# Patient Record
Sex: Female | Born: 1995 | Hispanic: No | Marital: Single | Smoking: Never smoker
Health system: Southern US, Community
[De-identification: ages and names within clinical notes are randomized; demographics above are authoritative.]

## PROBLEM LIST (undated history)

## (undated) DIAGNOSIS — Z789 Other specified health status: Secondary | ICD-10-CM

## (undated) HISTORY — PX: NO PAST SURGERIES: SHX2092

## (undated) HISTORY — PX: WISDOM TOOTH EXTRACTION: SHX21

## (undated) HISTORY — DX: Other specified health status: Z78.9

---
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
## 2014-07-15 ENCOUNTER — Telehealth: Payer: Self-pay | Admitting: *Deleted

## 2014-07-15 MED ORDER — NORETHIN ACE-ETH ESTRAD-FE 1-20 MG-MCG PO TABS: 1.0000 | ORAL_TABLET | Freq: Every day | ORAL | Status: DC

## 2014-07-15 NOTE — Telephone Encounter (Signed)
Pt would like to have a 90 day supply of birth control pill, rx sent

## 2015-05-25 ENCOUNTER — Other Ambulatory Visit: Payer: Self-pay | Admitting: *Deleted

## 2015-05-27 ENCOUNTER — Other Ambulatory Visit: Payer: Self-pay

## 2015-11-18 ENCOUNTER — Encounter: Payer: Self-pay | Admitting: Women's Health

## 2015-11-20 ENCOUNTER — Emergency Department (INDEPENDENT_AMBULATORY_CARE_PROVIDER_SITE_OTHER)
Admission: EM | Admit: 2015-11-20 | Discharge: 2015-11-20 | Disposition: A | Discharge status: Home / Self Care | Payer: 59 | Source: Home / Self Care | Attending: Family Medicine | Admitting: Family Medicine

## 2015-11-20 ENCOUNTER — Encounter (HOSPITAL_COMMUNITY): Payer: Self-pay | Admitting: *Deleted

## 2015-11-20 DIAGNOSIS — J069 Acute upper respiratory infection, unspecified: Secondary | ICD-10-CM

## 2015-11-20 LAB — POCT RAPID STREP A: STREPTOCOCCUS, GROUP A SCREEN (DIRECT): NEGATIVE

## 2015-11-20 MED ORDER — IPRATROPIUM BROMIDE 0.06 % NA SOLN: 2.0000 | Freq: Four times a day (QID) | NASAL | Status: DC

## 2015-11-20 NOTE — ED Provider Notes (Signed)
CSN: [id number]     Arrival date & time 11/20/15  1642 History   First MD Initiated Contact with Patient 11/20/15 1756     Chief Complaint  Patient presents with  . Sore Throat   (Consider location/radiation/quality/duration/timing/severity/associated sxs/prior Treatment) Patient is a 20 y.o. female presenting with pharyngitis. The history is provided by the patient.  Sore Throat This is a new problem. The current episode started 2 days ago. The problem has not changed since onset.Pertinent negatives include no chest pain, no abdominal pain and no headaches. The symptoms are aggravated by swallowing.    History reviewed. No pertinent past medical history. History reviewed. No pertinent past surgical history. Family History  Problem Relation Age of Onset  . Hyperlipidemia Mother   . Heart disease Maternal Grandmother    Social History  Substance Use Topics  . Smoking status: Never Smoker   . Smokeless tobacco: Never Used  . Alcohol Use: No   OB History    Gravida Para Term Preterm AB TAB SAB Ectopic Multiple Living   0              Review of Systems  Constitutional: Negative.   HENT: Positive for congestion, postnasal drip, rhinorrhea and sore throat.   Respiratory: Negative.   Cardiovascular: Negative.  Negative for chest pain.  Gastrointestinal: Negative for abdominal pain.  Neurological: Negative for headaches.  All other systems reviewed and are negative.   Allergies  Review of patient's allergies indicates no known allergies.  Home Medications   Prior to Admission medications   Medication Sig Start Date End Date Taking? Authorizing Provider  acetaminophen (TYLENOL) 325 MG tablet Take 650 mg by mouth every 6 (six) hours as needed.    Historical Provider, MD  ipratropium (ATROVENT) 0.06 % nasal spray Place 2 sprays into both nostrils 4 (four) times daily. 11/20/15   Linna HoffJames D Myndi Wamble, MD  norethindrone-ethinyl estradiol (JUNEL FE,GILDESS FE,LOESTRIN FE) 1-20 MG-MCG tablet  Take 1 tablet by mouth daily. 07/15/14   Harrington ChallengerNancy J Young, NP   Meds Ordered and Administered this Visit  Medications - No data to display  BP 116/79 mmHg  Pulse 57  Temp(Src) 98.5 F (36.9 C) (Oral)  SpO2 100%  LMP 11/03/2015 No data found.   Physical Exam  Constitutional: She is oriented to person, place, and time. She appears well-developed and well-nourished. No distress.  HENT:  Head: Normocephalic.  Right Ear: External ear normal.  Left Ear: External ear normal.  Mouth/Throat: Oropharynx is clear and moist.  Neck: Normal range of motion. Neck supple.  Pulmonary/Chest: Effort normal and breath sounds normal.  Lymphadenopathy:    She has no cervical adenopathy.  Neurological: She is alert and oriented to person, place, and time.  Skin: Skin is warm and dry.  Nursing note and vitals reviewed.   ED Course  Procedures (including critical care time)  Labs Review Labs Reviewed  POCT RAPID STREP A  strep neg.  Imaging Review No results found.   Visual Acuity Review  Right Eye Distance:   Left Eye Distance:   Bilateral Distance:    Right Eye Near:   Left Eye Near:    Bilateral Near:         MDM   1. URI (upper respiratory infection)        Linna HoffJames D Lyam Provencio, MD 11/20/15 1820

## 2015-11-20 NOTE — ED Notes (Signed)
Pt   Reports       sorethroat      X   2  Days     Hurts  To  Swallow            Pt  Sitting  On  Exam table    Speaking  In  Complete  sentances  And  Is  In no  Acute  Distress

## 2015-11-20 NOTE — Discharge Instructions (Signed)
Drink plenty of fluids as discussed, use medicine as prescribed, and mucinex or delsym for cough. Return or see your doctor if further problems °

## 2015-11-23 LAB — CULTURE, GROUP A STREP

## 2015-11-24 NOTE — ED Notes (Signed)
Discussed w JD Kindl. No treatment required

## 2015-12-13 ENCOUNTER — Encounter: Payer: Self-pay | Admitting: Women's Health

## 2015-12-13 ENCOUNTER — Ambulatory Visit (INDEPENDENT_AMBULATORY_CARE_PROVIDER_SITE_OTHER): Payer: 59 | Admitting: Women's Health

## 2015-12-13 VITALS — BP 122/80 | Ht 63.0 in | Wt 152.0 lb

## 2015-12-13 DIAGNOSIS — B9689 Other specified bacterial agents as the cause of diseases classified elsewhere: Secondary | ICD-10-CM

## 2015-12-13 DIAGNOSIS — Z01419 Encounter for gynecological examination (general) (routine) without abnormal findings: Secondary | ICD-10-CM | POA: Diagnosis not present

## 2015-12-13 DIAGNOSIS — Z30011 Encounter for initial prescription of contraceptive pills: Secondary | ICD-10-CM

## 2015-12-13 DIAGNOSIS — B3731 Acute candidiasis of vulva and vagina: Secondary | ICD-10-CM

## 2015-12-13 DIAGNOSIS — B373 Candidiasis of vulva and vagina: Secondary | ICD-10-CM | POA: Diagnosis not present

## 2015-12-13 DIAGNOSIS — N760 Acute vaginitis: Secondary | ICD-10-CM | POA: Diagnosis not present

## 2015-12-13 DIAGNOSIS — A499 Bacterial infection, unspecified: Secondary | ICD-10-CM | POA: Diagnosis not present

## 2015-12-13 LAB — CBC WITH DIFFERENTIAL/PLATELET
BASOS ABS: 0.1 10*3/uL (ref 0.0–0.1)
BASOS PCT: 1 % (ref 0–1)
EOS ABS: 0.1 10*3/uL (ref 0.0–0.7)
Eosinophils Relative: 1 % (ref 0–5)
HCT: 41.1 % (ref 36.0–46.0)
Hemoglobin: 13.8 g/dL (ref 12.0–15.0)
LYMPHS ABS: 2.4 10*3/uL (ref 0.7–4.0)
Lymphocytes Relative: 39 % (ref 12–46)
MCH: 30.3 pg (ref 26.0–34.0)
MCHC: 33.6 g/dL (ref 30.0–36.0)
MCV: 90.3 fL (ref 78.0–100.0)
MPV: 8.5 fL — AB (ref 8.6–12.4)
Monocytes Absolute: 0.4 10*3/uL (ref 0.1–1.0)
Monocytes Relative: 6 % (ref 3–12)
NEUTROS PCT: 53 % (ref 43–77)
Neutro Abs: 3.3 10*3/uL (ref 1.7–7.7)
PLATELETS: 365 10*3/uL (ref 150–400)
RBC: 4.55 MIL/uL (ref 3.87–5.11)
RDW: 13.2 % (ref 11.5–15.5)
WBC: 6.2 10*3/uL (ref 4.0–10.5)

## 2015-12-13 LAB — WET PREP FOR TRICH, YEAST, CLUE: Trich, Wet Prep: NONE SEEN

## 2015-12-13 MED ORDER — NORGESTIMATE-ETH ESTRADIOL 0.25-35 MG-MCG PO TABS: 1.0000 | ORAL_TABLET | Freq: Every day | ORAL | Status: DC

## 2015-12-13 MED ORDER — METRONIDAZOLE 0.75 % VA GEL: VAGINAL | Status: DC

## 2015-12-13 MED ORDER — FLUCONAZOLE 150 MG PO TABS: 150.0000 mg | ORAL_TABLET | Freq: Once | ORAL | Status: DC

## 2015-12-13 NOTE — Patient Instructions (Addendum)
Health Maintenance, Female Adopting a healthy lifestyle and getting preventive care can go a long way to promote health and wellness. Talk with your health care provider about what schedule of regular examinations is right for you. This is a good chance for you to check in with your provider about disease prevention and staying healthy. In between checkups, there are plenty of things you can do on your own. Experts have done a lot of research about which lifestyle changes and preventive measures are most likely to keep you healthy. Ask your health care provider for more information. WEIGHT AND DIET  Eat a healthy diet  Be sure to include plenty of vegetables, fruits, low-fat dairy products, and lean protein.  Do not eat a lot of foods high in solid fats, added sugars, or salt.  Get regular exercise. This is one of the most important things you can do for your health.  Most adults should exercise for at least 150 minutes each week. The exercise should increase your heart rate and make you sweat (moderate-intensity exercise).  Most adults should also do strengthening exercises at least twice a week. This is in addition to the moderate-intensity exercise.  Maintain a healthy weight  Body mass index (BMI) is a measurement that can be used to identify possible weight problems. It estimates body fat based on height and weight. Your health care provider can help determine your BMI and help you achieve or maintain a healthy weight.  For females 20 years of age and older:   A BMI below 18.5 is considered underweight.  A BMI of 18.5 to 24.9 is normal.  A BMI of 25 to 29.9 is considered overweight.  A BMI of 30 and above is considered obese.  Watch levels of cholesterol and blood lipids  You should start having your blood tested for lipids and cholesterol at 20 years of age, then have this test every 5 years.  You may need to have your cholesterol levels checked more often if:  Your lipid  or cholesterol levels are high.  You are older than 21 years of age.  You are at high risk for heart disease.  CANCER SCREENING   Lung Cancer  Lung cancer screening is recommended for adults 55-80 years old who are at high risk for lung cancer because of a history of smoking.  A yearly low-dose CT scan of the lungs is recommended for people who:  Currently smoke.  Have quit within the past 15 years.  Have at least a 30-pack-year history of smoking. A pack year is smoking an average of one pack of cigarettes a day for 1 year.  Yearly screening should continue until it has been 15 years since you quit.  Yearly screening should stop if you develop a health problem that would prevent you from having lung cancer treatment.  Breast Cancer  Practice breast self-awareness. This means understanding how your breasts normally appear and feel.  It also means doing regular breast self-exams. Let your health care provider know about any changes, no matter how small.  If you are in your 20s or 30s, you should have a clinical breast exam (CBE) by a health care provider every 1-3 years as part of a regular health exam.  If you are 40 or older, have a CBE every year. Also consider having a breast X-ray (mammogram) every year.  If you have a family history of breast cancer, talk to your health care provider about genetic screening.  If you   are at high risk for breast cancer, talk to your health care provider about having an MRI and a mammogram every year.  Breast cancer gene (BRCA) assessment is recommended for women who have family members with BRCA-related cancers. BRCA-related cancers include:  Breast.  Ovarian.  Tubal.  Peritoneal cancers.  Results of the assessment will determine the need for genetic counseling and BRCA1 and BRCA2 testing. Cervical Cancer Your health care provider may recommend that you be screened regularly for cancer of the pelvic organs (ovaries, uterus, and  vagina). This screening involves a pelvic examination, including checking for microscopic changes to the surface of your cervix (Pap test). You may be encouraged to have this screening done every 3 years, beginning at age 21.  For women ages 30-65, health care providers may recommend pelvic exams and Pap testing every 3 years, or they may recommend the Pap and pelvic exam, combined with testing for human papilloma virus (HPV), every 5 years. Some types of HPV increase your risk of cervical cancer. Testing for HPV may also be done on women of any age with unclear Pap test results.  Other health care providers may not recommend any screening for nonpregnant women who are considered low risk for pelvic cancer and who do not have symptoms. Ask your health care provider if a screening pelvic exam is right for you.  If you have had past treatment for cervical cancer or a condition that could lead to cancer, you need Pap tests and screening for cancer for at least 20 years after your treatment. If Pap tests have been discontinued, your risk factors (such as having a new sexual partner) need to be reassessed to determine if screening should resume. Some women have medical problems that increase the chance of getting cervical cancer. In these cases, your health care provider may recommend more frequent screening and Pap tests. Colorectal Cancer  This type of cancer can be detected and often prevented.  Routine colorectal cancer screening usually begins at 21 years of age and continues through 21 years of age.  Your health care provider may recommend screening at an earlier age if you have risk factors for colon cancer.  Your health care provider may also recommend using home test kits to check for hidden blood in the stool.  A small camera at the end of a tube can be used to examine your colon directly (sigmoidoscopy or colonoscopy). This is done to check for the earliest forms of colorectal  cancer.  Routine screening usually begins at age 50.  Direct examination of the colon should be repeated every 5-10 years through 21 years of age. However, you may need to be screened more often if early forms of precancerous polyps or small growths are found. Skin Cancer  Check your skin from head to toe regularly.  Tell your health care provider about any new moles or changes in moles, especially if there is a change in a mole's shape or color.  Also tell your health care provider if you have a mole that is larger than the size of a pencil eraser.  Always use sunscreen. Apply sunscreen liberally and repeatedly throughout the day.  Protect yourself by wearing long sleeves, pants, a wide-brimmed hat, and sunglasses whenever you are outside. HEART DISEASE, DIABETES, AND HIGH BLOOD PRESSURE   High blood pressure causes heart disease and increases the risk of stroke. High blood pressure is more likely to develop in:  People who have blood pressure in the high end   of the normal range (130-139/85-89 mm Hg).  People who are overweight or obese.  People who are African American.  If you are 38-23 years of age, have your blood pressure checked every 3-5 years. If you are 61 years of age or older, have your blood pressure checked every year. You should have your blood pressure measured twice--once when you are at a hospital or clinic, and once when you are not at a hospital or clinic. Record the average of the two measurements. To check your blood pressure when you are not at a hospital or clinic, you can use:  An automated blood pressure machine at a pharmacy.  A home blood pressure monitor.  If you are between 45 years and 39 years old, ask your health care provider if you should take aspirin to prevent strokes.  Have regular diabetes screenings. This involves taking a blood sample to check your fasting blood sugar level.  If you are at a normal weight and have a low risk for diabetes,  have this test once every three years after 21 years of age.  If you are overweight and have a high risk for diabetes, consider being tested at a younger age or more often. PREVENTING INFECTION  Hepatitis B  If you have a higher risk for hepatitis B, you should be screened for this virus. You are considered at high risk for hepatitis B if:  You were born in a country where hepatitis B is common. Ask your health care provider which countries are considered high risk.  Your parents were born in a high-risk country, and you have not been immunized against hepatitis B (hepatitis B vaccine).  You have HIV or AIDS.  You use needles to inject street drugs.  You live with someone who has hepatitis B.  You have had sex with someone who has hepatitis B.  You get hemodialysis treatment.  You take certain medicines for conditions, including cancer, organ transplantation, and autoimmune conditions. Hepatitis C  Blood testing is recommended for:  Everyone born from [birth year] through 1966.  Anyone with known risk factors for hepatitis C. Sexually transmitted infections (STIs)  You should be screened for sexually transmitted infections (STIs) including gonorrhea and chlamydia if:  You are sexually active and are younger than 21 years of age.  You are older than 21 years of age and your health care provider tells you that you are at risk for this type of infection.  Your sexual activity has changed since you were last screened and you are at an increased risk for chlamydia or gonorrhea. Ask your health care provider if you are at risk.  If you do not have HIV, but are at risk, it may be recommended that you take a prescription medicine daily to prevent HIV infection. This is called pre-exposure prophylaxis (PrEP). You are considered at risk if:  You are sexually active and do not regularly use condoms or know the HIV status of your partner(s).  You take drugs by injection.  You are sexually  active with a partner who has HIV. Talk with your health care provider about whether you are at high risk of being infected with HIV. If you choose to begin PrEP, you should first be tested for HIV. You should then be tested every 3 months for as long as you are taking PrEP.  PREGNANCY   If you are premenopausal and you may become pregnant, ask your health care provider about preconception counseling.  If you may  become pregnant, take 400 to 800 micrograms (mcg) of folic acid every day.  If you want to prevent pregnancy, talk to your health care provider about birth control (contraception). OSTEOPOROSIS AND MENOPAUSE   Osteoporosis is a disease in which the bones lose minerals and strength with aging. This can result in serious bone fractures. Your risk for osteoporosis can be identified using a bone density scan.  If you are 11 years of age or older, or if you are at risk for osteoporosis and fractures, ask your health care provider if you should be screened.  Ask your health care provider whether you should take a calcium or vitamin D supplement to lower your risk for osteoporosis.  Menopause may have certain physical symptoms and risks.  Hormone replacement therapy may reduce some of these symptoms and risks. Talk to your health care provider about whether hormone replacement therapy is right for you.  HOME CARE INSTRUCTIONS   Schedule regular health, dental, and eye exams.  Stay current with your immunizations.   Do not use any tobacco products including cigarettes, chewing tobacco, or electronic cigarettes.  If you are pregnant, do not drink alcohol.  If you are breastfeeding, limit how much and how often you drink alcohol.  Limit alcohol intake to no more than 1 drink per day for nonpregnant women. One drink equals 12 ounces of beer, 5 ounces of wine, or 1 ounces of hard liquor.  Do not use street drugs.  Do not share needles.  Ask your health care provider for help if  you need support or information about quitting drugs.  Tell your health care provider if you often feel depressed.  Tell your health care provider if you have ever been abused or do not feel safe at home.   This information is not intended to replace advice given to you by your health care provider. Make sure you discuss any questions you have with your health care provider.   Document Released: 05/21/2011 Document Revised: 11/26/2014 Document Reviewed: 10/07/2013 Elsevier Interactive Patient Education 2016 Else[employer]. Monilial Vaginitis Vaginitis in a soreness, swelling and redness (inflammation) of the vagina and vulva. Monilial vaginitis is not a sexually transmitted infection. CAUSES  Yeast vaginitis is caused by yeast (candida) that is normally found in your vagina. With a yeast infection, the candida has overgrown in number to a point that upsets the chemical balance. SYMPTOMS   White, thick vaginal discharge.  Swelling, itching, redness and irritation of the vagina and possibly the lips of the vagina (vulva).  Burning or painful urination.  Painful intercourse. DIAGNOSIS  Things that may contribute to monilial vaginitis are:  Postmenopausal and virginal states.  Pregnancy.  Infections.  Being tired, sick or stressed, especially if you had monilial vaginitis in the past.  Diabetes. Good control will help lower the chance.  Birth control pills.  Tight fitting garments.  Using bubble bath, feminine sprays, douches or deodorant tampons.  Taking certain medications that kill germs (antibiotics).  Sporadic recurrence can occur if you become ill. TREATMENT  Your caregiver will give you medication.  There are several kinds of anti monilial vaginal creams and suppositories specific for monilial vaginitis. For recurrent yeast infections, use a suppository or cream in the vagina 2 times a week, or as directed.  Anti-monilial or steroid cream for the itching or  irritation of the vulva may also be used. Get your caregiver's permission.  Painting the vagina with methylene blue solution may help if the monilial cream does  not work.  Eating yogurt may help prevent monilial vaginitis. HOME CARE INSTRUCTIONS   Finish all medication as prescribed.  Do not have sex until treatment is completed or after your caregiver tells you it is okay.  Take warm sitz baths.  Do not douche.  Do not use tampons, especially scented ones.  Wear cotton underwear.  Avoid tight pants and panty hose.  Tell your sexual partner that you have a yeast infection. They should go to their caregiver if they have symptoms such as mild rash or itching.  Your sexual partner should be treated as well if your infection is difficult to eliminate.  Practice safer sex. Use condoms.  Some vaginal medications cause latex condoms to fail. Vaginal medications that harm condoms are:  Cleocin cream.  Butoconazole (Femstat).  Terconazole (Terazol) vaginal suppository.  Miconazole (Monistat) (may be purchased over the counter). SEEK MEDICAL CARE IF:   You have a temperature by mouth above 102 F (38.9 C).  The infection is getting worse after 2 days of treatment.  The infection is not getting better after 3 days of treatment.  You develop blisters in or around your vagina.  You develop vaginal bleeding, and it is not your menstrual period.  You have pain when you urinate.  You develop intestinal problems.  You have pain with sexual intercourse.   This information is not intended to replace advice given to you by your health care provider. Make sure you discuss any questions you have with your health care provider.   Document Released: 08/15/2005 Document Revised: 01/28/2012 Document Reviewed: 05/09/2015 Elsevier Interactive Patient Education [employer].

## 2015-12-13 NOTE — Progress Notes (Signed)
Alexandra Blackburn 1996/02/14 [id number]    History:    Presents for annual exam.  Regular monthly cycle using no contraception. Negative STD screen with current partner at primary care. Has received gardasil.  Past medical history, past surgical history, family history and social history were all reviewed and documented in the EPIC chart. 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 Parents healthy.  ROS:  A ROS was performed and pertinent positives and negatives are included.  Exam:  Filed Vitals:   12/13/15 1033  BP: 122/80    General appearance:  Normal Thyroid:  Symmetrical, normal in size, without palpable masses or nodularity. Respiratory  Auscultation:  Clear without wheezing or rhonchi Cardiovascular  Auscultation:  Regular rate, without rubs, murmurs or gallops  Edema/varicosities:  Not grossly evident Abdominal  Soft,nontender, without masses, guarding or rebound.  Liver/spleen:  No organomegaly noted  Hernia:  None appreciated  Skin  Inspection:  Grossly normal numerous tattoos   Breasts: Examined lying and sitting/bilateral piercing    Right: Without masses, retractions, discharge or axillary adenopathy.     Left: Without masses, retractions, discharge or axillary adenopathy. Gentitourinary   Inguinal/mons:  Normal without inguinal adenopathy  External genitalia:  Normal  BUS/Urethra/Skene's glands:  Normal  Vagina:  Moderate curdy discharge wet prep positive for many yeast, few clues, TNTC bacteria  Cervix:  Normal  Uterus:   normal in size, shape and contour.  Midline and mobile  Adnexa/parametria:     Rt: Without masses or tenderness.   Lt: Without masses or tenderness.  Anus and perineum: Normal    Assessment/Plan:  20 y.o. SBF G0 for annual exam with no complaints.    Monthly cycle/dysmenorrhea Contraception management Yeast vaginitis and bacterial vaginosis  Plan: Contraception options reviewed will try  Sprintec prescription, proper use given and reviewed slight risk for blood clots and strokes. Start with next cycle, condoms encouraged until after first month on Sprintec. SBE's, exercise, calcium rich diet, MVI daily encouraged. Diflucan 150 by mouth times one dose with refill. MetroGel vaginal cream 1 applicator at bedtime 5, alcohol precautions reviewed. Instructed to call for no relief of discharge. CBC, UAHarrington Challenger WHNP, 11:04 AM 12/13/2015

## 2015-12-14 LAB — URINALYSIS W MICROSCOPIC + REFLEX CULTURE
Bilirubin Urine: NEGATIVE
Casts: NONE SEEN [LPF]
Crystals: NONE SEEN [HPF]
GLUCOSE, UA: NEGATIVE
HGB URINE DIPSTICK: NEGATIVE
Ketones, ur: NEGATIVE
NITRITE: NEGATIVE
PH: 5.5 (ref 5.0–8.0)
Protein, ur: NEGATIVE
RBC / HPF: NONE SEEN RBC/HPF (ref <=2)
Specific Gravity, Urine: 1.026 (ref 1.001–1.035)
Yeast: NONE SEEN [HPF]

## 2015-12-15 LAB — URINE CULTURE: Colony Count: 50000

## 2016-10-22 ENCOUNTER — Encounter (HOSPITAL_COMMUNITY): Payer: Self-pay | Admitting: Emergency Medicine

## 2016-10-22 ENCOUNTER — Ambulatory Visit (HOSPITAL_COMMUNITY)
Admission: EM | Admit: 2016-10-22 | Discharge: 2016-10-22 | Disposition: A | Discharge status: Home / Self Care | Payer: 59 | Attending: Family Medicine | Admitting: Family Medicine

## 2016-10-22 DIAGNOSIS — Z79899 Other long term (current) drug therapy: Secondary | ICD-10-CM | POA: Insufficient documentation

## 2016-10-22 DIAGNOSIS — J069 Acute upper respiratory infection, unspecified: Secondary | ICD-10-CM | POA: Diagnosis not present

## 2016-10-22 DIAGNOSIS — J029 Acute pharyngitis, unspecified: Secondary | ICD-10-CM | POA: Insufficient documentation

## 2016-10-22 LAB — POCT RAPID STREP A: Streptococcus, Group A Screen (Direct): NEGATIVE

## 2016-10-22 NOTE — ED Provider Notes (Signed)
[facility]    CSN: [id number] Arrival date & time: 10/22/16  1945     History   Chief Complaint Chief Complaint  Patient presents with  . Sore Throat    HPI Alexandra Blackburn is a 20 y.o. female.   The history is provided by the patient.  Sore Throat  This is a new problem. The current episode started 6 to 12 hours ago. The problem has not changed since onset.Pertinent negatives include no chest pain and no abdominal pain. Nothing aggravates the symptoms.    History reviewed. No pertinent past medical history.  Patient Active Problem List   Diagnosis Date Noted  . Dysmenorrhea 10/23/2013    History reviewed. No pertinent surgical history.  OB History    Gravida Para Term Preterm AB Living   0             SAB TAB Ectopic Multiple Live Births                   Home Medications    Prior to Admission medications   Medication Sig Start Date End Date Taking? Authorizing Provider  norgestimate-ethinyl estradiol (ORTHO-CYCLEN,SPRINTEC,PREVIFEM) 0.25-35 MG-MCG tablet Take 1 tablet by mouth daily. 12/13/15  Yes Harrington ChallengerNancy J Young, NP  fluconazole (DIFLUCAN) 150 MG tablet Take 1 tablet (150 mg total) by mouth once. 12/13/15   Harrington ChallengerNancy J Young, NP  metroNIDAZOLE (METROGEL VAGINAL) 0.75 % vaginal gel 1 applicator per vagina at HS x 5 12/13/15   Harrington ChallengerNancy J Young, NP    Family History Family History  Problem Relation Age of Onset  . Hyperlipidemia Mother   . Heart disease Maternal Grandmother     Social History Social History  Substance Use Topics  . Smoking status: Never Smoker  . Smokeless tobacco: Never Used  . Alcohol use No     Allergies   Patient has no known allergies.   Review of Systems Review of Systems  Constitutional: Negative.   HENT: Positive for postnasal drip and rhinorrhea.   Respiratory: Negative.   Cardiovascular: Negative.  Negative for chest pain.  Gastrointestinal: Negative for abdominal pain.  All other systems reviewed and are  negative.    Physical Exam Triage Vital Signs ED Triage Vitals  Enc Vitals Group     BP 10/22/16 2008 93/86     Pulse Rate 10/22/16 2008 92     Resp 10/22/16 2008 16     Temp 10/22/16 2008 100.4 F (38 C)     Temp Source 10/22/16 2008 Oral     SpO2 10/22/16 2008 99 %     Weight --      Height --      Head Circumference --      Peak Flow --      Pain Score 10/22/16 2007 4     Pain Loc --      Pain Edu? --      Excl. in GC? --    No data found.   Updated Vital Signs BP 93/86 (BP Location: Left Arm)   Pulse 92   Temp 100.4 F (38 C) (Oral)   Resp 16   LMP 10/09/2016   SpO2 99%   Visual Acuity Right Eye Distance:   Left Eye Distance:   Bilateral Distance:    Right Eye Near:   Left Eye Near:    Bilateral Near:     Physical Exam  Constitutional: She is oriented to person, place, and time. She appears well-developed and  well-nourished.  HENT:  Right Ear: External ear normal.  Left Ear: External ear normal.  Mouth/Throat: Oropharynx is clear and moist.  Eyes: Conjunctivae are normal. Pupils are equal, round, and reactive to light.  Neck: Normal range of motion. Neck supple.  Cardiovascular: Normal rate and regular rhythm.   Pulmonary/Chest: Effort normal and breath sounds normal.  Lymphadenopathy:    She has cervical adenopathy.  Neurological: She is alert and oriented to person, place, and time. No cranial nerve deficit.  Skin: Skin is warm and dry.  Nursing note and vitals reviewed.    UC Treatments / Results  Labs (all labs ordered are listed, but only abnormal results are displayed) Labs Reviewed  POCT RAPID STREP A  strep-neg.  EKG  EKG Interpretation None       Radiology No results found.  Procedures Procedures (including critical care time)  Medications Ordered in UC Medications - No data to display   Initial Impression / Assessment and Plan / UC Course  I have reviewed the triage vital signs and the nursing notes.  Pertinent  labs & imaging results that were available during my care of the patient were reviewed by me and considered in my medical decision making (see chart for details).  Clinical Course       Final Clinical Impressions(s) / UC Diagnoses   Final diagnoses:  None    New Prescriptions New Prescriptions   No medications on file     Linna HoffJames D Kindl, MD 10/22/16 2039

## 2016-10-22 NOTE — Discharge Instructions (Signed)
Drink plenty of fluids as discussed, use mucinex or delsym for cough. Return or see your doctor if further problems °

## 2016-10-22 NOTE — ED Triage Notes (Signed)
Reports knot on right side of neck and under chin.  No pain in throat with swallowing.  Denies fever.  Denies runny nose, denies cough.  No difficulty breathing

## 2016-10-25 LAB — CULTURE, GROUP A STREP (THRC)

## 2016-12-13 ENCOUNTER — Encounter: Payer: 59 | Admitting: Women's Health

## 2016-12-19 ENCOUNTER — Encounter: Payer: Self-pay | Admitting: Women's Health

## 2016-12-19 ENCOUNTER — Ambulatory Visit (INDEPENDENT_AMBULATORY_CARE_PROVIDER_SITE_OTHER): Payer: 59 | Admitting: Women's Health

## 2016-12-19 VITALS — BP 128/74 | Ht 63.0 in | Wt 175.0 lb

## 2016-12-19 DIAGNOSIS — Z01419 Encounter for gynecological examination (general) (routine) without abnormal findings: Secondary | ICD-10-CM

## 2016-12-19 DIAGNOSIS — B9689 Other specified bacterial agents as the cause of diseases classified elsewhere: Secondary | ICD-10-CM

## 2016-12-19 DIAGNOSIS — Z113 Encounter for screening for infections with a predominantly sexual mode of transmission: Secondary | ICD-10-CM

## 2016-12-19 DIAGNOSIS — B373 Candidiasis of vulva and vagina: Secondary | ICD-10-CM

## 2016-12-19 DIAGNOSIS — B3731 Acute candidiasis of vulva and vagina: Secondary | ICD-10-CM

## 2016-12-19 DIAGNOSIS — Z30011 Encounter for initial prescription of contraceptive pills: Secondary | ICD-10-CM

## 2016-12-19 DIAGNOSIS — N760 Acute vaginitis: Secondary | ICD-10-CM

## 2016-12-19 LAB — CBC WITH DIFFERENTIAL/PLATELET
BASOS ABS: 54 {cells}/uL (ref 0–200)
BASOS PCT: 1 %
EOS ABS: 108 {cells}/uL (ref 15–500)
EOS PCT: 2 %
HCT: 36.8 % (ref 35.0–45.0)
HEMOGLOBIN: 12.2 g/dL (ref 11.7–15.5)
LYMPHS ABS: 1620 {cells}/uL (ref 850–3900)
Lymphocytes Relative: 30 %
MCH: 29.2 pg (ref 27.0–33.0)
MCHC: 33.2 g/dL (ref 32.0–36.0)
MCV: 88 fL (ref 80.0–100.0)
MPV: 8.3 fL (ref 7.5–12.5)
Monocytes Absolute: 378 cells/uL (ref 200–950)
Monocytes Relative: 7 %
NEUTROS ABS: 3240 {cells}/uL (ref 1500–7800)
Neutrophils Relative %: 60 %
PLATELETS: 354 10*3/uL (ref 140–400)
RBC: 4.18 MIL/uL (ref 3.80–5.10)
RDW: 13.1 % (ref 11.0–15.0)
WBC: 5.4 10*3/uL (ref 3.8–10.8)

## 2016-12-19 LAB — WET PREP FOR TRICH, YEAST, CLUE: Trich, Wet Prep: NONE SEEN

## 2016-12-19 MED ORDER — METRONIDAZOLE 500 MG PO TABS: 500.0000 mg | ORAL_TABLET | Freq: Two times a day (BID) | ORAL | 0 refills | Status: DC

## 2016-12-19 MED ORDER — NORGESTIMATE-ETH ESTRADIOL 0.25-35 MG-MCG PO TABS
1.0000 | ORAL_TABLET | Freq: Every day | ORAL | 4 refills | Status: DC
Start: 2016-12-19 — End: 2018-04-02

## 2016-12-19 MED ORDER — FLUCONAZOLE 150 MG PO TABS: 150.0000 mg | ORAL_TABLET | Freq: Once | ORAL | 1 refills | Status: AC

## 2016-12-19 NOTE — Addendum Note (Signed)
Addended by: Berna SpareASTILLO, Zadiel Leyh A on: 12/19/2016 03:38 PM   Modules accepted: Orders

## 2016-12-19 NOTE — Addendum Note (Signed)
Addended by: Berna SpareASTILLO, BLANCA A on: 12/19/2016 03:56 PM   Modules accepted: Orders

## 2016-12-19 NOTE — Progress Notes (Signed)
Alexandra Blackburn October 28, 1996 [id number]    History:    Presents for annual exam.  Monthly 4-5 day cycle on Sprintec with good relief of dysmenorrhea but occasional menorrhagia. Gardasil series completed. Not sexually active, negative STD screen with past partner. 20 pound weight gain in the past year.  Past medical history, past surgical history, family history and social history were all reviewed and documented in the EPIC chart. Working in a lab. Parents healthy.  ROS:  A ROS was performed and pertinent positives and negatives are included.  Exam:  Vitals:   12/19/16 1414  BP: 128/74  Weight: 175 lb (79.4 kg)  Height: 5\' 3"  (1.6 m)   Body mass index is 31 kg/m.   General appearance:  Normal Thyroid:  Symmetrical, normal in size, without palpable masses or nodularity. Respiratory  Auscultation:  Clear without wheezing or rhonchi Cardiovascular  Auscultation:  Regular rate, without rubs, murmurs or gallops  Edema/varicosities:  Not grossly evident Abdominal  Soft,nontender, without masses, guarding or rebound.  Liver/spleen:  No organomegaly noted  Hernia:  None appreciated  Skin  Inspection:  Grossly normal   Breasts: Examined lying and sitting. Bilateral piercings    Right: Without masses, retractions, discharge or axillary adenopathy.     Left: Without masses, retractions, discharge or axillary adenopathy. Gentitourinary   Inguinal/mons:  Normal without inguinal adenopathy  External genitalia:  Normal  BUS/Urethra/Skene's glands:  Normal  Vagina:  Moderate white discharge with odor, wet prep positive for yeast and clues, TNTC bacteria  Cervix:  Normal  Uterus:   normal in size, shape and contour.  Midline and mobile  Adnexa/parametria:     Rt: Without masses or tenderness.   Lt: Without masses or tenderness.  Anus and perineum: Normal    Assessment/Plan:  21 y.o. S WF G0 for annual exam.  Monthly cycle on Sprintec Bacteria vaginosis Yeast  vaginitis Obesity  Plan: Sprintec prescription, proper use, slight risk for blood clots and strokes reviewed. Condoms encouraged if sexually active. Flagyl 500 twice daily for 7 days, #14, alcohol precautions reviewed. Diflucan 150 by mouth times one dose with refill. Yeast prevention discussed. SBE's, increase exercise and decrease calories for weight loss, calcium rich diet, MVI daily encouraged. Campus safety reviewed. CBC,  Pap, GC/Chlamydia, declines need for HIV, hepatitis or RPR.    Harrington ChallengerYOUNG,Alexandra Blackburn [facility] CenterWHNP, 2:47 PM 12/19/2016

## 2016-12-19 NOTE — Patient Instructions (Signed)
Health Maintenance, Female Introduction Adopting a healthy lifestyle and getting preventive care can go a long way to promote health and wellness. Talk with your health care provider about what schedule of regular examinations is right for you. This is a good chance for you to check in with your provider about disease prevention and staying healthy. In between checkups, there are plenty of things you can do on your own. Experts have done a lot of research about which lifestyle changes and preventive measures are most likely to keep you healthy. Ask your health care provider for more information. Weight and diet Eat a healthy diet  Be sure to include plenty of vegetables, fruits, low-fat dairy products, and lean protein.  Do not eat a lot of foods high in solid fats, added sugars, or salt.  Get regular exercise. This is one of the most important things you can do for your health.  Most adults should exercise for at least 150 minutes each week. The exercise should increase your heart rate and make you sweat (moderate-intensity exercise).  Most adults should also do strengthening exercises at least twice a week. This is in addition to the moderate-intensity exercise. Maintain a healthy weight  Body mass index (BMI) is a measurement that can be used to identify possible weight problems. It estimates body fat based on height and weight. Your health care provider can help determine your BMI and help you achieve or maintain a healthy weight.  For females 63 years of age and older:  A BMI below 18.5 is considered underweight.  A BMI of 18.5 to 24.9 is normal.  A BMI of 25 to 29.9 is considered overweight.  A BMI of 30 and above is considered obese. Watch levels of cholesterol and blood lipids  You should start having your blood tested for lipids and cholesterol at 22 years of age, then have this test every 5 years.  You may need to have your cholesterol levels checked more often if:  Your  lipid or cholesterol levels are high.  You are older than 22 years of age.  You are at high risk for heart disease. Cancer screening Lung Cancer  Lung cancer screening is recommended for adults 56-22 years old who are at high risk for lung cancer because of a history of smoking.  A yearly low-dose CT scan of the lungs is recommended for people who:  Currently smoke.  Have quit within the past 15 years.  Have at least a 30-pack-year history of smoking. A pack year is smoking an average of one pack of cigarettes a day for 1 year.  Yearly screening should continue until it has been 15 years since you quit.  Yearly screening should stop if you develop a health problem that would prevent you from having lung cancer treatment. Breast Cancer  Practice breast self-awareness. This means understanding how your breasts normally appear and feel.  It also means doing regular breast self-exams. Let your health care provider know about any changes, no matter how small.  If you are in your 20s or 30s, you should have a clinical breast exam (CBE) by a health care provider every 1-3 years as part of a regular health exam.  If you are 35 or older, have a CBE every year. Also consider having a breast X-ray (mammogram) every year.  If you have a family history of breast cancer, talk to your health care provider about genetic screening.  If you are at high risk for breast cancer,  talk to your health care provider about having an MRI and a mammogram every year.  Breast cancer gene (BRCA) assessment is recommended for women who have family members with BRCA-related cancers. BRCA-related cancers include:  Breast.  Ovarian.  Tubal.  Peritoneal cancers.  Results of the assessment will determine the need for genetic counseling and BRCA1 and BRCA2 testing. Cervical Cancer  Your health care provider may recommend that you be screened regularly for cancer of the pelvic organs (ovaries, uterus, and  vagina). This screening involves a pelvic examination, including checking for microscopic changes to the surface of your cervix (Pap test). You may be encouraged to have this screening done every 3 years, beginning at age 21.  For women ages 30-65, health care providers may recommend pelvic exams and Pap testing every 3 years, or they may recommend the Pap and pelvic exam, combined with testing for human papilloma virus (HPV), every 5 years. Some types of HPV increase your risk of cervical cancer. Testing for HPV may also be done on women of any age with unclear Pap test results.  Other health care providers may not recommend any screening for nonpregnant women who are considered low risk for pelvic cancer and who do not have symptoms. Ask your health care provider if a screening pelvic exam is right for you.  If you have had past treatment for cervical cancer or a condition that could lead to cancer, you need Pap tests and screening for cancer for at least 20 years after your treatment. If Pap tests have been discontinued, your risk factors (such as having a new sexual partner) need to be reassessed to determine if screening should resume. Some women have medical problems that increase the chance of getting cervical cancer. In these cases, your health care provider may recommend more frequent screening and Pap tests. Colorectal Cancer  This type of cancer can be detected and often prevented.  Routine colorectal cancer screening usually begins at 22 years of age and continues through 22 years of age.  Your health care provider may recommend screening at an earlier age if you have risk factors for colon cancer.  Your health care provider may also recommend using home test kits to check for hidden blood in the stool.  A small camera at the end of a tube can be used to examine your colon directly (sigmoidoscopy or colonoscopy). This is done to check for the earliest forms of colorectal  cancer.  Routine screening usually begins at age 50.  Direct examination of the colon should be repeated every 5-10 years through 22 years of age. However, you may need to be screened more often if early forms of precancerous polyps or small growths are found. Skin Cancer  Check your skin from head to toe regularly.  Tell your health care provider about any new moles or changes in moles, especially if there is a change in a mole's shape or color.  Also tell your health care provider if you have a mole that is larger than the size of a pencil eraser.  Always use sunscreen. Apply sunscreen liberally and repeatedly throughout the day.  Protect yourself by wearing long sleeves, pants, a wide-brimmed hat, and sunglasses whenever you are outside. Heart disease, diabetes, and high blood pressure  High blood pressure causes heart disease and increases the risk of stroke. High blood pressure is more likely to develop in:  People who have blood pressure in the high end of the normal range (130-139/85-89 mm Hg).    People who are overweight or obese.  People who are African American.  If you are 18-39 years of age, have your blood pressure checked every 3-5 years. If you are 40 years of age or older, have your blood pressure checked every year. You should have your blood pressure measured twice-once when you are at a hospital or clinic, and once when you are not at a hospital or clinic. Record the average of the two measurements. To check your blood pressure when you are not at a hospital or clinic, you can use:  An automated blood pressure machine at a pharmacy.  A home blood pressure monitor.  If you are between 55 years and 79 years old, ask your health care provider if you should take aspirin to prevent strokes.  Have regular diabetes screenings. This involves taking a blood sample to check your fasting blood sugar level.  If you are at a normal weight and have a low risk for diabetes,  have this test once every three years after 22 years of age.  If you are overweight and have a high risk for diabetes, consider being tested at a younger age or more often. Preventing infection Hepatitis B  If you have a higher risk for hepatitis B, you should be screened for this virus. You are considered at high risk for hepatitis B if:  You were born in a country where hepatitis B is common. Ask your health care provider which countries are considered high risk.  Your parents were born in a high-risk country, and you have not been immunized against hepatitis B (hepatitis B vaccine).  You have HIV or AIDS.  You use needles to inject street drugs.  You live with someone who has hepatitis B.  You have had sex with someone who has hepatitis B.  You get hemodialysis treatment.  You take certain medicines for conditions, including cancer, organ transplantation, and autoimmune conditions. Hepatitis C  Blood testing is recommended for:  Everyone born from 1945 through 1965.  Anyone with known risk factors for hepatitis C. Sexually transmitted infections (STIs)  You should be screened for sexually transmitted infections (STIs) including gonorrhea and chlamydia if:  You are sexually active and are younger than 22 years of age.  You are older than 22 years of age and your health care provider tells you that you are at risk for this type of infection.  Your sexual activity has changed since you were last screened and you are at an increased risk for chlamydia or gonorrhea. Ask your health care provider if you are at risk.  If you do not have HIV, but are at risk, it may be recommended that you take a prescription medicine daily to prevent HIV infection. This is called pre-exposure prophylaxis (PrEP). You are considered at risk if:  You are sexually active and do not regularly use condoms or know the HIV status of your partner(s).  You take drugs by injection.  You are sexually  active with a partner who has HIV. Talk with your health care provider about whether you are at high risk of being infected with HIV. If you choose to begin PrEP, you should first be tested for HIV. You should then be tested every 3 months for as long as you are taking PrEP. Pregnancy  If you are premenopausal and you may become pregnant, ask your health care provider about preconception counseling.  If you may become pregnant, take 400 to 800 micrograms (mcg) of folic acid   every day.  If you want to prevent pregnancy, talk to your health care provider about birth control (contraception). Osteoporosis and menopause  Osteoporosis is a disease in which the bones lose minerals and strength with aging. This can result in serious bone fractures. Your risk for osteoporosis can be identified using a bone density scan.  If you are 36 years of age or older, or if you are at risk for osteoporosis and fractures, ask your health care provider if you should be screened.  Ask your health care provider whether you should take a calcium or vitamin D supplement to lower your risk for osteoporosis.  Menopause may have certain physical symptoms and risks.  Hormone replacement therapy may reduce some of these symptoms and risks. Talk to your health care provider about whether hormone replacement therapy is right for you. Follow these instructions at home:  Schedule regular health, dental, and eye exams.  Stay current with your immunizations.  Do not use any tobacco products including cigarettes, chewing tobacco, or electronic cigarettes.  If you are pregnant, do not drink alcohol.  If you are breastfeeding, limit how much and how often you drink alcohol.  Limit alcohol intake to no more than 1 drink per day for nonpregnant women. One drink equals 12 ounces of beer, 5 ounces of wine, or 1 ounces of hard liquor.  Do not use street drugs.  Do not share needles.  Ask your health care provider for  help if you need support or information about quitting drugs.  Tell your health care provider if you often feel depressed.  Tell your health care provider if you have ever been abused or do not feel safe at home. This information is not intended to replace advice given to you by your health care provider. Make sure you discuss any questions you have with your health care provider. Document Released: 05/21/2011 Document Revised: 04/12/2016 Document Reviewed: 08/09/2015  2017 Elsevier Vaginal Yeast infection, Adult Vaginal yeast infection is a condition that causes soreness, swelling, and redness (inflammation) of the vagina. It also causes vaginal discharge. This is a common condition. Some women get this infection frequently. What are the causes? This condition is caused by a change in the normal balance of the yeast (candida) and bacteria that live in the vagina. This change causes an overgrowth of yeast, which causes the inflammation. What increases the risk? This condition is more likely to develop in:  Women who take antibiotic medicines.  Women who have diabetes.  Women who take birth control pills.  Women who are pregnant.  Women who douche often.  Women who have a weak defense (immune) system.  Women who have been taking steroid medicines for a long time.  Women who frequently wear tight clothing. What are the signs or symptoms? Symptoms of this condition include:  White, thick vaginal discharge.  Swelling, itching, redness, and irritation of the vagina. The lips of the vagina (vulva) may be affected as well.  Pain or a burning feeling while urinating.  Pain during sex. How is this diagnosed? This condition is diagnosed with a medical history and physical exam. This will include a pelvic exam. Your health care provider will examine a sample of your vaginal discharge under a microscope. Your health care provider may send this sample for testing to confirm the  diagnosis. How is this treated? This condition is treated with medicine. Medicines may be over-the-counter or prescription. You may be told to use one or more of the following:  Medicine that is taken orally.  Medicine that is applied as a cream.  Medicine that is inserted directly into the vagina (suppository). Follow these instructions at home:  Take or apply over-the-counter and prescription medicines only as told by your health care provider.  Do not have sex until your health care provider has approved. Tell your sex partner that you have a yeast infection. That person should go to his or her health care provider if he or she develops symptoms.  Do not wear tight clothes, such as pantyhose or tight pants.  Avoid using tampons until your health care provider approves.  Eat more yogurt. This may help to keep your yeast infection from returning.  Try taking a sitz bath to help with discomfort. This is a warm water bath that is taken while you are sitting down. The water should only come up to your hips and should cover your buttocks. Do this 3-4 times per day or as told by your health care provider.  Do not douche.  Wear breathable, cotton underwear.  If you have diabetes, keep your blood sugar levels under control. Contact a health care provider if:  You have a fever.  Your symptoms go away and then return.  Your symptoms do not get better with treatment.  Your symptoms get worse.  You have new symptoms.  You develop blisters in or around your vagina.  You have blood coming from your vagina and it is not your menstrual period.  You develop pain in your abdomen. This information is not intended to replace advice given to you by your health care provider. Make sure you discuss any questions you have with your health care provider. Document Released: 08/15/2005 Document Revised: 04/18/2016 Document Reviewed: 05/09/2015 Elsevier Interactive Patient Education  2017  [employer] Bacterial Vaginosis Bacterial vaginosis is an infection of the vagina. It happens when too many germs (bacteria) grow in the vagina. This infection puts you at risk for infections from sex (STIs). Treating this infection can lower your risk for some STIs. You should also treat this if you are pregnant. It can cause your baby to be born early. Follow these instructions at home: Medicines  Take over-the-counter and prescription medicines only as told by your doctor.  Take or use your antibiotic medicine as told by your doctor. Do not stop taking or using it even if you start to feel better. General instructions  If you your sexual partner is a woman, tell her that you have this infection. She needs to get treatment if she has symptoms. If you have a female partner, he does not need to be treated.  During treatment:  Avoid sex.  Do not douche.  Avoid alcohol as told.  Avoid breastfeeding as told.  Drink enough fluid to keep your pee (urine) clear or pale yellow.  Keep your vagina and butt (rectum) clean.  Wash the area with warm water every day.  Wipe from front to back after you use the toilet.  Keep all follow-up visits as told by your doctor. This is important. Preventing this condition  Do not douche.  Use only warm water to wash around your vagina.  Use protection when you have sex. This includes:  Latex condoms.  Dental dams.  Limit how many people you have sex with. It is best to only have sex with the same person (be monogamous).  Get tested for STIs. Have your partner get tested.  Wear underwear that is cotton or lined with cotton.  Avoid tight pants and pantyhose. This is most important in summer.  Do not use any products that have nicotine or tobacco in them. These include cigarettes and e-cigarettes. If you need help quitting, ask your doctor.  Do not use illegal drugs.  Limit how much alcohol you drink. Contact a doctor if:  Your  symptoms do not get better, even after you are treated.  You have more discharge or pain when you pee (urinate).  You have a fever.  You have pain in your belly (abdomen).  You have pain with sex.  Your bleed from your vagina between periods. Summary  This infection happens when too many germs (bacteria) grow in the vagina.  Treating this condition can lower your risk for some infections from sex (STIs).  You should also treat this if you are pregnant. It can cause early (premature) birth.  Do not stop taking or using your antibiotic medicine even if you start to feel better. This information is not intended to replace advice given to you by your health care provider. Make sure you discuss any questions you have with your health care provider. Document Released: 08/14/2008 Document Revised: 07/21/2016 Document Reviewed: 07/21/2016 Elsevier Interactive Patient Education  2017 [employer].

## 2016-12-20 LAB — URINALYSIS W MICROSCOPIC + REFLEX CULTURE
Bacteria, UA: NONE SEEN [HPF]
Bilirubin Urine: NEGATIVE
CASTS: NONE SEEN [LPF]
Crystals: NONE SEEN [HPF]
Glucose, UA: NEGATIVE
Hgb urine dipstick: NEGATIVE
KETONES UR: NEGATIVE
Leukocytes, UA: NEGATIVE
NITRITE: NEGATIVE
PH: 5.5 (ref 5.0–8.0)
Protein, ur: NEGATIVE
RBC / HPF: NONE SEEN RBC/HPF (ref <=2)
SPECIFIC GRAVITY, URINE: 1.021 (ref 1.001–1.035)
WBC, UA: NONE SEEN WBC/HPF (ref <=5)
Yeast: NONE SEEN [HPF]

## 2016-12-20 LAB — GC/CHLAMYDIA PROBE AMP
CT Probe RNA: NOT DETECTED
GC PROBE AMP APTIMA: NOT DETECTED

## 2016-12-20 LAB — PAP IG W/ RFLX HPV ASCU

## 2017-03-01 ENCOUNTER — Other Ambulatory Visit: Payer: Self-pay | Admitting: Women's Health

## 2017-03-01 DIAGNOSIS — Z30011 Encounter for initial prescription of contraceptive pills: Secondary | ICD-10-CM

## 2017-03-19 ENCOUNTER — Emergency Department (HOSPITAL_COMMUNITY)
Admission: EM | Admit: 2017-03-19 | Discharge: 2017-03-20 | Disposition: A | Discharge status: Home / Self Care | Payer: No Typology Code available for payment source

## 2017-03-19 DIAGNOSIS — Y92410 Unspecified street and highway as the place of occurrence of the external cause: Secondary | ICD-10-CM | POA: Insufficient documentation

## 2017-03-19 DIAGNOSIS — Y939 Activity, unspecified: Secondary | ICD-10-CM | POA: Diagnosis not present

## 2017-03-19 DIAGNOSIS — Z79899 Other long term (current) drug therapy: Secondary | ICD-10-CM | POA: Diagnosis not present

## 2017-03-19 DIAGNOSIS — S0990XA Unspecified injury of head, initial encounter: Secondary | ICD-10-CM | POA: Insufficient documentation

## 2017-03-19 DIAGNOSIS — Y999 Unspecified external cause status: Secondary | ICD-10-CM | POA: Insufficient documentation

## 2017-03-19 NOTE — Discharge Instructions (Signed)
Ice your head several times a day. Ibuprofen or Tylenol for pain. You may wake up with some soreness tomorrow, that is normal. Try heating pads, stretches, stay active. Do not do anything strenuous however. Follow-up with family doctor as needed. Return if worsening headache, vomiting, confusion, memory loss, numbness or weakness in extremities, any new concerning symptoms.
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
## 2017-03-20 NOTE — ED Triage Notes (Signed)
I am only acting as an agent to d/c this pt. From the computer. I did not see nor treat this patient.

## 2017-03-20 NOTE — ED Notes (Signed)
This RN unable to d/c the patient in the computer d/t technical problem. IT called.

## 2017-04-03 ENCOUNTER — Encounter: Payer: Self-pay | Admitting: Gynecology

## 2017-12-23 ENCOUNTER — Encounter: Payer: 59 | Admitting: Women's Health

## 2017-12-23 DIAGNOSIS — Z0289 Encounter for other administrative examinations: Secondary | ICD-10-CM

## 2018-01-22 ENCOUNTER — Other Ambulatory Visit: Payer: Self-pay | Admitting: Women's Health

## 2018-01-22 DIAGNOSIS — Z30011 Encounter for initial prescription of contraceptive pills: Secondary | ICD-10-CM

## 2018-01-28 ENCOUNTER — Other Ambulatory Visit: Payer: Self-pay | Admitting: Women's Health

## 2018-01-28 DIAGNOSIS — Z30011 Encounter for initial prescription of contraceptive pills: Secondary | ICD-10-CM

## 2018-04-02 ENCOUNTER — Ambulatory Visit: Payer: BC Managed Care – PPO | Admitting: Women's Health

## 2018-04-02 ENCOUNTER — Encounter: Payer: Self-pay | Admitting: Women's Health

## 2018-04-02 VITALS — BP 122/78 | Ht 63.0 in | Wt 167.0 lb

## 2018-04-02 DIAGNOSIS — Z3040 Encounter for surveillance of contraceptives, unspecified: Secondary | ICD-10-CM | POA: Diagnosis not present

## 2018-04-02 DIAGNOSIS — B9689 Other specified bacterial agents as the cause of diseases classified elsewhere: Secondary | ICD-10-CM | POA: Diagnosis not present

## 2018-04-02 DIAGNOSIS — Z01419 Encounter for gynecological examination (general) (routine) without abnormal findings: Secondary | ICD-10-CM | POA: Diagnosis not present

## 2018-04-02 DIAGNOSIS — N760 Acute vaginitis: Secondary | ICD-10-CM | POA: Diagnosis not present

## 2018-04-02 DIAGNOSIS — Z113 Encounter for screening for infections with a predominantly sexual mode of transmission: Secondary | ICD-10-CM

## 2018-04-02 DIAGNOSIS — N898 Other specified noninflammatory disorders of vagina: Secondary | ICD-10-CM | POA: Diagnosis not present

## 2018-04-02 LAB — WET PREP FOR TRICH, YEAST, CLUE

## 2018-04-02 MED ORDER — NORETHIN ACE-ETH ESTRAD-FE 1-20 MG-MCG PO TABS: 1.0000 | ORAL_TABLET | Freq: Every day | ORAL | 4 refills | Status: DC

## 2018-04-02 MED ORDER — METRONIDAZOLE 500 MG PO TABS: 500.0000 mg | ORAL_TABLET | Freq: Two times a day (BID) | ORAL | 0 refills | Status: DC

## 2018-04-02 NOTE — Patient Instructions (Signed)

## 2018-04-02 NOTE — Progress Notes (Signed)
Alexandra Blackburn Aug 15, 1996 [id number]    History:    Presents for annual exam.  Regular monthly cycle on Sprintec but continues to have dysmenorrhea.  Gardasil series completed.  Normal Pap history.  New Partner.    Past medical history, past surgical history, family history and social history were all reviewed and documented in the EPIC chart.  Working in the lab.  Parents healthy.  ROS:  A ROS was performed and pertinent positives and negatives are included.  Exam:  Vitals:   04/02/18 1455  BP: 122/78  Weight: 167 lb (75.8 kg)  Height:  (1.6 m)   Body mass index is 29.58 kg/m.   General appearance:  Normal Thyroid:  Symmetrical, normal in size, without palpable masses or nodularity. Respiratory  Auscultation:  Clear without wheezing or rhonchi Cardiovascular  Auscultation:  Regular rate, without rubs, murmurs or gallops  Edema/varicosities:  Not grossly evident Abdominal  Soft,nontender, without masses, guarding or rebound.  Liver/spleen:  No organomegaly noted  Hernia:  None appreciated  Skin  Inspection:  Grossly normal   Breasts: Examined lying and sitting.     Right: Without masses, retractions, discharge or axillary adenopathy.     Left: Without masses, retractions, discharge or axillary adenopathy. Gentitourinary   Inguinal/mons:  Normal without inguinal adenopathy  External genitalia:  Normal  BUS/Urethra/Skene's glands:  Normal  Vagina:  Normal  Cervix:  Normal  Uterus:   normal in size, shape and contour.  Midline and mobile  Adnexa/parametria:     Rt: Without masses or tenderness.   Lt: Without masses or tenderness.  Anus and perineum: Normal   Assessment/Plan:  22 y.o. S W[place] G0 for annual exam with dysmenorrhea.  Monthly cycle on Sprintec STD screen  Plan: Options reviewed we will try different pill Loestrin 1/20 prescription, proper use, slight risk for blood clots and strokes reviewed.  Finish a current pack and start.  Instructed to call if  continued problems.  Condoms encouraged until permanent partner.  CBC, GC/chlamydia, HIV, hep B, C, RPR.    Harrington Challenger [facility], 5:33 PM 04/02/2018

## 2018-04-03 LAB — C. TRACHOMATIS/N. GONORRHOEAE RNA
C. TRACHOMATIS RNA, TMA: NOT DETECTED
N. gonorrhoeae RNA, TMA: NOT DETECTED

## 2018-04-03 LAB — CBC WITH DIFFERENTIAL/PLATELET
BASOS PCT: 0.7 %
Basophils Absolute: 56 cells/uL (ref 0–200)
EOS PCT: 1.7 %
Eosinophils Absolute: 136 cells/uL (ref 15–500)
HEMATOCRIT: 36.8 % (ref 35.0–45.0)
Hemoglobin: 12.7 g/dL (ref 11.7–15.5)
LYMPHS ABS: 2488 {cells}/uL (ref 850–3900)
MCH: 30.4 pg (ref 27.0–33.0)
MCHC: 34.5 g/dL (ref 32.0–36.0)
MCV: 88 fL (ref 80.0–100.0)
MPV: 9.3 fL (ref 7.5–12.5)
Monocytes Relative: 6 %
Neutro Abs: 4840 cells/uL (ref 1500–7800)
Neutrophils Relative %: 60.5 %
PLATELETS: 403 10*3/uL — AB (ref 140–400)
RBC: 4.18 10*6/uL (ref 3.80–5.10)
RDW: 12.1 % (ref 11.0–15.0)
TOTAL LYMPHOCYTE: 31.1 %
WBC: 8 10*3/uL (ref 3.8–10.8)
WBCMIX: 480 {cells}/uL (ref 200–950)

## 2018-04-03 LAB — HEPATITIS B SURFACE ANTIGEN: HEP B S AG: NONREACTIVE

## 2018-04-03 LAB — HEPATITIS C ANTIBODY
HEP C AB: NONREACTIVE
SIGNAL TO CUT-OFF: 0.02 (ref <1.00)

## 2018-04-03 LAB — RPR: RPR Ser Ql: NONREACTIVE

## 2018-04-03 LAB — HIV ANTIBODY (ROUTINE TESTING W REFLEX): HIV 1&2 Ab, 4th Generation: NONREACTIVE

## 2018-04-16 ENCOUNTER — Other Ambulatory Visit: Payer: Self-pay | Admitting: Women's Health

## 2018-04-16 DIAGNOSIS — Z30011 Encounter for initial prescription of contraceptive pills: Secondary | ICD-10-CM

## 2018-11-19 NOTE — L&D Delivery Note (Addendum)
OB/GYN Faculty Practice Delivery Note  Alexandra Blackburn is a 23 y.o. G1P0000 s/p SVD at [redacted]w[redacted]d. She was admitted for SOL.   ROM: 4h 61m with clear fluid GBS Status: --Alexandra Blackburn (08/13 0947) Maximum Maternal Temperature: 99.54F  Labor Progress: . Patient presented to L&D for SOL. Initial SVE: 1/thick/-3. Patient had SROM and received Epidural. She then progressed to complete.   Delivery Date/Time: 9/13 @ [id number] Delivery: Called to room and patient was complete and pushing. Head delivered in OA. Tight nuchal cord present and reduced. Shoulder and body delivered in usual fashion. Infant with spontaneous cry, placed on mother's abdomen, dried and stimulated. Cord clamped x 2 after 1-minute delay, and cut by FOB. Cord blood drawn. Placenta delivered spontaneously with gentle cord traction. Fundus firm with massage and Pitocin. Labia, perineum, vagina, and cervix inspected inspected with first degree perineal laceration which was repaired with 3-0 Vicryl in a standard fashion. Liletta removed from package and inserted manually to fundus. Patient tolerated procedure well. Lot #: [id number] Exp: 02/2023 Baby Weight: 3385g  Placenta: Sent to L&D Complications: None Lacerations: 1st degree perineal EBL: 187 mL Analgesia: Epidural   Infant: APGAR (1 MIN): 8   APGAR (5 MINS): 9   APGAR (10 MINS):     Alexandra Ellison, MD OB Family Medicine Fellow, [facility] 08/02/2019, 6:48 AM

## 2019-03-24 DIAGNOSIS — Z3490 Encounter for supervision of normal pregnancy, unspecified, unspecified trimester: Secondary | ICD-10-CM | POA: Insufficient documentation

## 2019-03-25 ENCOUNTER — Other Ambulatory Visit (HOSPITAL_COMMUNITY)
Admission: RE | Admit: 2019-03-25 | Discharge: 2019-03-25 | Disposition: A | Discharge status: Home / Self Care | Payer: 59 | Source: Ambulatory Visit | Attending: Obstetrics and Gynecology | Admitting: Obstetrics and Gynecology

## 2019-03-25 ENCOUNTER — Ambulatory Visit (INDEPENDENT_AMBULATORY_CARE_PROVIDER_SITE_OTHER): Payer: Medicaid Other | Admitting: Obstetrics and Gynecology

## 2019-03-25 ENCOUNTER — Encounter: Payer: Self-pay | Admitting: Obstetrics and Gynecology

## 2019-03-25 ENCOUNTER — Other Ambulatory Visit: Payer: Self-pay

## 2019-03-25 DIAGNOSIS — Z3A22 22 weeks gestation of pregnancy: Secondary | ICD-10-CM

## 2019-03-25 DIAGNOSIS — Z3490 Encounter for supervision of normal pregnancy, unspecified, unspecified trimester: Secondary | ICD-10-CM | POA: Diagnosis not present

## 2019-03-25 DIAGNOSIS — O26892 Other specified pregnancy related conditions, second trimester: Secondary | ICD-10-CM

## 2019-03-25 DIAGNOSIS — O99212 Obesity complicating pregnancy, second trimester: Secondary | ICD-10-CM

## 2019-03-25 DIAGNOSIS — Z3481 Encounter for supervision of other normal pregnancy, first trimester: Secondary | ICD-10-CM

## 2019-03-25 DIAGNOSIS — Z6791 Unspecified blood type, Rh negative: Secondary | ICD-10-CM

## 2019-03-25 DIAGNOSIS — O26899 Other specified pregnancy related conditions, unspecified trimester: Secondary | ICD-10-CM

## 2019-03-25 DIAGNOSIS — O99210 Obesity complicating pregnancy, unspecified trimester: Secondary | ICD-10-CM | POA: Insufficient documentation

## 2019-03-25 MED ORDER — ASPIRIN EC 81 MG PO TBEC
81.0000 mg | DELAYED_RELEASE_TABLET | Freq: Every day | ORAL | 2 refills | Status: DC
Start: 2019-03-25 — End: 2021-01-05

## 2019-03-25 NOTE — Patient Instructions (Signed)
° °Second Trimester of Pregnancy °The second trimester is from week 14 through week 27 (months 4 through 6). The second trimester is often a time when you feel your best. Your body has adjusted to being pregnant, and you begin to feel better physically. Usually, morning sickness has lessened or quit completely, you may have more energy, and you may have an increase in appetite. The second trimester is also a time when the fetus is growing rapidly. At the end of the sixth month, the fetus is about 9 inches long and weighs about 1½ pounds. You will likely begin to feel the baby move (quickening) between 16 and 20 weeks of pregnancy. °Body changes during your second trimester °Your body continues to go through many changes during your second trimester. The changes vary from woman to woman. °· Your weight will continue to increase. You will notice your lower abdomen bulging out. °· You may begin to get stretch marks on your hips, abdomen, and breasts. °· You may develop headaches that can be relieved by medicines. The medicines should be approved by your health care provider. °· You may urinate more often because the fetus is pressing on your bladder. °· You may develop or continue to have heartburn as a result of your pregnancy. °· You may develop constipation because certain hormones are causing the muscles that push waste through your intestines to slow down. °· You may develop hemorrhoids or swollen, bulging veins (varicose veins). °· You may have back pain. This is caused by: °? Weight gain. °? Pregnancy hormones that are relaxing the joints in your pelvis. °? A shift in weight and the muscles that support your balance. °· Your breasts will continue to grow and they will continue to become tender. °· Your gums may bleed and may be sensitive to brushing and flossing. °· Dark spots or blotches (chloasma, mask of pregnancy) may develop on your face. This will likely fade after the baby is born. °· A dark line from  your belly button to the pubic area (linea nigra) may appear. This will likely fade after the baby is born. °· You may have changes in your hair. These can include thickening of your hair, rapid growth, and changes in texture. Some women also have hair loss during or after pregnancy, or hair that feels dry or thin. Your hair will most likely return to normal after your baby is born. °What to expect at prenatal visits °During a routine prenatal visit: °· You will be weighed to make sure you and the fetus are growing normally. °· Your blood pressure will be taken. °· Your abdomen will be measured to track your baby's growth. °· The fetal heartbeat will be listened to. °· Any test results from the previous visit will be discussed. °Your health care provider may ask you: °· How you are feeling. °· If you are feeling the baby move. °· If you have had any abnormal symptoms, such as leaking fluid, bleeding, severe headaches, or abdominal cramping. °· If you are using any tobacco products, including cigarettes, chewing tobacco, and electronic cigarettes. °· If you have any questions. °Other tests that may be performed during your second trimester include: °· Blood tests that check for: °? Low iron levels (anemia). °? High blood sugar that affects pregnant women (gestational diabetes) between 24 and 28 weeks. °? Rh antibodies. This is to check for a protein on red blood cells (Rh factor). °· Urine tests to check for infections, diabetes, or protein in   the urine. °· An ultrasound to confirm the proper growth and development of the baby. °· An amniocentesis to check for possible genetic problems. °· Fetal screens for spina bifida and Down syndrome. °· HIV (human immunodeficiency virus) testing. Routine prenatal testing includes screening for HIV, unless you choose not to have this test. °Follow these instructions at home: °Medicines °· Follow your health care provider's instructions regarding medicine use. Specific medicines  may be either safe or unsafe to take during pregnancy. °· Take a prenatal vitamin that contains at least 600 micrograms (mcg) of folic acid. °· If you develop constipation, try taking a stool softener if your health care provider approves. °Eating and drinking ° °· Eat a balanced diet that includes fresh fruits and vegetables, whole grains, good sources of protein such as meat, eggs, or tofu, and low-fat dairy. Your health care provider will help you determine the amount of weight gain that is right for you. °· Avoid raw meat and uncooked cheese. These carry germs that can cause birth defects in the baby. °· If you have low calcium intake from food, talk to your health care provider about whether you should take a daily calcium supplement. °· Limit foods that are high in fat and processed sugars, such as fried and sweet foods. °· To prevent constipation: °? Drink enough fluid to keep your urine clear or pale yellow. °? Eat foods that are high in fiber, such as fresh fruits and vegetables, whole grains, and beans. °Activity °· Exercise only as directed by your health care provider. Most women can continue their usual exercise routine during pregnancy. Try to exercise for 30 minutes at least 5 days a week. Stop exercising if you experience uterine contractions. °· Avoid heavy lifting, wear low heel shoes, and practice good posture. °· A sexual relationship may be continued unless your health care provider directs you otherwise. °Relieving pain and discomfort °· Wear a good support bra to prevent discomfort from breast tenderness. °· Take warm sitz baths to soothe any pain or discomfort caused by hemorrhoids. Use hemorrhoid cream if your health care provider approves. °· Rest with your legs elevated if you have leg cramps or low back pain. °· If you develop varicose veins, wear support hose. Elevate your feet for 15 minutes, 3-4 times a day. Limit salt in your diet. °Prenatal Care °· Write down your questions. Take  them to your prenatal visits. °· Keep all your prenatal visits as told by your health care provider. This is important. °Safety °· Wear your seat belt at all times when driving. °· Make a list of emergency phone numbers, including numbers for family, friends, the hospital, and police and fire departments. °General instructions °· Ask your health care provider for a referral to a local prenatal education class. Begin classes no later than the beginning of month 6 of your pregnancy. °· Ask for help if you have counseling or nutritional needs during pregnancy. Your health care provider can offer advice or refer you to specialists for help with various needs. °· Do not use hot tubs, steam rooms, or saunas. °· Do not douche or use tampons or scented sanitary pads. °· Do not cross your legs for long periods of time. °· Avoid cat litter boxes and soil used by cats. These carry germs that can cause birth defects in the baby and possibly loss of the fetus by miscarriage or stillbirth. °· Avoid all smoking, herbs, alcohol, and unprescribed drugs. Chemicals in these products can affect the   formation and growth of the baby. °· Do not use any products that contain nicotine or tobacco, such as cigarettes and e-cigarettes. If you need help quitting, ask your health care provider. °· Visit your dentist if you have not gone yet during your pregnancy. Use a soft toothbrush to brush your teeth and be gentle when you floss. °Contact a health care provider if: °· You have dizziness. °· You have mild pelvic cramps, pelvic pressure, or nagging pain in the abdominal area. °· You have persistent nausea, vomiting, or diarrhea. °· You have a bad smelling vaginal discharge. °· You have pain when you urinate. °Get help right away if: °· You have a fever. °· You are leaking fluid from your vagina. °· You have spotting or bleeding from your vagina. °· You have severe abdominal cramping or pain. °· You have rapid weight gain or weight loss. °· You  have shortness of breath with chest pain. °· You notice sudden or extreme swelling of your face, hands, ankles, feet, or legs. °· You have not felt your baby move in over an hour. °· You have severe headaches that do not go away when you take medicine. °· You have vision changes. °Summary °· The second trimester is from week 14 through week 27 (months 4 through 6). It is also a time when the fetus is growing rapidly. °· Your body goes through many changes during pregnancy. The changes vary from woman to woman. °· Avoid all smoking, herbs, alcohol, and unprescribed drugs. These chemicals affect the formation and growth your baby. °· Do not use any tobacco products, such as cigarettes, chewing tobacco, and e-cigarettes. If you need help quitting, ask your health care provider. °· Contact your health care provider if you have any questions. Keep all prenatal visits as told by your health care provider. This is important. °This information is not intended to replace advice given to you by your health care provider. Make sure you discuss any questions you have with your health care provider. °Document Released: 10/30/2001 Document Revised: 12/11/2016 Document Reviewed: 12/11/2016 °[employer] Interactive Patient Education © 2019 [employer] ° ° °Contraception Choices °Contraception, also called birth control, refers to methods or devices that prevent pregnancy. °Hormonal methods °Contraceptive implant ° °A contraceptive implant is a thin, plastic tube that contains a hormone. It is inserted into the upper part of the arm. It can remain in place for up to 3 years. °Progestin-only injections °Progestin-only injections are injections of progestin, a synthetic form of the hormone progesterone. They are given every 3 months by a health care provider. °Birth control pills ° °Birth control pills are pills that contain hormones that prevent pregnancy. They must be taken once a day, preferably at the same time each day. °Birth  control patch ° °The birth control patch contains hormones that prevent pregnancy. It is placed on the skin and must be changed once a week for three weeks and removed on the fourth week. A prescription is needed to use this method of contraception. °Vaginal ring ° °A vaginal ring contains hormones that prevent pregnancy. It is placed in the vagina for three weeks and removed on the fourth week. After that, the process is repeated with a new ring. A prescription is needed to use this method of contraception. °Emergency contraceptive °Emergency contraceptives prevent pregnancy after unprotected sex. They come in pill form and can be taken up to 5 days after sex. They work best the sooner they are taken after having sex. Most   emergency contraceptives are available without a prescription. This method should not be used as your only form of birth control. °Barrier methods °Female condom ° °A female condom is a thin sheath that is worn over the penis during sex. Condoms keep sperm from going inside a woman's body. They can be used with a spermicide to increase their effectiveness. They should be disposed after a single use. °Female condom ° °A female condom is a soft, loose-fitting sheath that is put into the vagina before sex. The condom keeps sperm from going inside a woman's body. They should be disposed after a single use. °Diaphragm ° °A diaphragm is a soft, dome-shaped barrier. It is inserted into the vagina before sex, along with a spermicide. The diaphragm blocks sperm from entering the uterus, and the spermicide kills sperm. A diaphragm should be left in the vagina for 6-8 hours after sex and removed within 24 hours. °A diaphragm is prescribed and fitted by a health care provider. A diaphragm should be replaced every 1-2 years, after giving birth, after gaining more than 15 lb (6.8 kg), and after pelvic surgery. °Cervical cap ° °A cervical cap is a round, soft latex or plastic cup that fits over the cervix. It is  inserted into the vagina before sex, along with spermicide. It blocks sperm from entering the uterus. The cap should be left in place for 6-8 hours after sex and removed within 48 hours. A cervical cap must be prescribed and fitted by a health care provider. It should be replaced every 2 years. °Sponge ° °A sponge is a soft, circular piece of polyurethane foam with spermicide on it. The sponge helps block sperm from entering the uterus, and the spermicide kills sperm. To use it, you make it wet and then insert it into the vagina. It should be inserted before sex, left in for at least 6 hours after sex, and removed and thrown away within 30 hours. °Spermicides °Spermicides are chemicals that kill or block sperm from entering the cervix and uterus. They can come as a cream, jelly, suppository, foam, or tablet. A spermicide should be inserted into the vagina with an applicator at least 10-15 minutes before sex to allow time for it to work. The process must be repeated every time you have sex. Spermicides do not require a prescription. °Intrauterine contraception °Intrauterine device (IUD) °An IUD is a T-shaped device that is put in a woman's uterus. There are two types: °· Hormone IUD.This type contains progestin, a synthetic form of the hormone progesterone. This type can stay in place for 3-5 years. °· Copper IUD.This type is wrapped in copper wire. It can stay in place for 10 years. ° °Permanent methods of contraception °Female tubal ligation °In this method, a woman's fallopian tubes are sealed, tied, or blocked during surgery to prevent eggs from traveling to the uterus. °Hysteroscopic sterilization °In this method, a small, flexible insert is placed into each fallopian tube. The inserts cause scar tissue to form in the fallopian tubes and block them, so sperm cannot reach an egg. The procedure takes about 3 months to be effective. Another form of birth control must be used during those 3 months. °Female  sterilization °This is a procedure to tie off the tubes that carry sperm (vasectomy). After the procedure, the man can still ejaculate fluid (semen). °Natural planning methods °Natural family planning °In this method, a couple does not have sex on days when the woman could become pregnant. °Calendar method °This means keeping   track of the length of each menstrual cycle, identifying the days when pregnancy can happen, and not having sex on those days. °Ovulation method °In this method, a couple avoids sex during ovulation. °Symptothermal method °This method involves not having sex during ovulation. The woman typically checks for ovulation by watching changes in her temperature and in the consistency of cervical mucus. °Post-ovulation method °In this method, a couple waits to have sex until after ovulation. °Summary °· Contraception, also called birth control, means methods or devices that prevent pregnancy. °· Hormonal methods of contraception include implants, injections, pills, patches, vaginal rings, and emergency contraceptives. °· Barrier methods of contraception can include female condoms, female condoms, diaphragms, cervical caps, sponges, and spermicides. °· There are two types of IUDs (intrauterine devices). An IUD can be put in a woman's uterus to prevent pregnancy for 3-5 years. °· Permanent sterilization can be done through a procedure for males, females, or both. °· Natural family planning methods involve not having sex on days when the woman could become pregnant. °This information is not intended to replace advice given to you by your health care provider. Make sure you discuss any questions you have with your health care provider. °Document Released: 11/05/2005 Document Revised: 11/07/2017 Document Reviewed: 12/08/2016 °[employer] Interactive Patient Education © 2019 [employer] ° ° °Breastfeeding ° °Choosing to breastfeed is one of the best decisions you can make for yourself and your baby. A change in  hormones during pregnancy causes your breasts to make breast milk in your milk-producing glands. Hormones prevent breast milk from being released before your baby is born. They also prompt milk flow after birth. Once breastfeeding has begun, thoughts of your baby, as well as his or her sucking or crying, can stimulate the release of milk from your milk-producing glands. °Benefits of breastfeeding °Research shows that breastfeeding offers many health benefits for infants and mothers. It also offers a cost-free and convenient way to feed your baby. °For your baby °· Your first milk (colostrum) helps your baby's digestive system to function better. °· Special cells in your milk (antibodies) help your baby to fight off infections. °· Breastfed babies are less likely to develop asthma, allergies, obesity, or type 2 diabetes. They are also at lower risk for sudden infant death syndrome (SIDS). °· Nutrients in breast milk are better able to meet your baby’s needs compared to infant formula. °· Breast milk improves your baby's brain development. °For you °· Breastfeeding helps to create a very special bond between you and your baby. °· Breastfeeding is convenient. Breast milk costs nothing and is always available at the correct temperature. °· Breastfeeding helps to burn calories. It helps you to lose the weight that you gained during pregnancy. °· Breastfeeding makes your uterus return faster to its size before pregnancy. It also slows bleeding (lochia) after you give birth. °· Breastfeeding helps to lower your risk of developing type 2 diabetes, osteoporosis, rheumatoid arthritis, cardiovascular disease, and breast, ovarian, uterine, and endometrial cancer later in life. °Breastfeeding basics °Starting breastfeeding °· Find a comfortable place to sit or lie down, with your neck and back well-supported. °· Place a pillow or a rolled-up blanket under your baby to bring him or her to the level of your breast (if you are  seated). Nursing pillows are specially designed to help support your arms and your baby while you breastfeed. °· Make sure that your baby's tummy (abdomen) is facing your abdomen. °· Gently massage your breast. With your fingertips, massage from   the outer edges of your breast inward toward the nipple. This encourages milk flow. If your milk flows slowly, you may need to continue this action during the feeding. °· Support your breast with 4 fingers underneath and your thumb above your nipple (make the letter "C" with your hand). Make sure your fingers are well away from your nipple and your baby’s mouth. °· Stroke your baby's lips gently with your finger or nipple. °· When your baby's mouth is open wide enough, quickly bring your baby to your breast, placing your entire nipple and as much of the areola as possible into your baby's mouth. The areola is the colored area around your nipple. °? More areola should be visible above your baby's upper lip than below the lower lip. °? Your baby's lips should be opened and extended outward (flanged) to ensure an adequate, comfortable latch. °? Your baby's tongue should be between his or her lower gum and your breast. °· Make sure that your baby's mouth is correctly positioned around your nipple (latched). Your baby's lips should create a seal on your breast and be turned out (everted). °· It is common for your baby to suck about 2-3 minutes in order to start the flow of breast milk. °Latching °Teaching your baby how to latch onto your breast properly is very important. An improper latch can cause nipple pain, decreased milk supply, and poor weight gain in your baby. Also, if your baby is not latched onto your nipple properly, he or she may swallow some air during feeding. This can make your baby fussy. Burping your baby when you switch breasts during the feeding can help to get rid of the air. However, teaching your baby to latch on properly is still the best way to prevent  fussiness from swallowing air while breastfeeding. °Signs that your baby has successfully latched onto your nipple °· Silent tugging or silent sucking, without causing you pain. Infant's lips should be extended outward (flanged). °· Swallowing heard between every 3-4 sucks once your milk has started to flow (after your let-down milk reflex occurs). °· Muscle movement above and in front of his or her ears while sucking. °Signs that your baby has not successfully latched onto your nipple °· Sucking sounds or smacking sounds from your baby while breastfeeding. °· Nipple pain. °If you think your baby has not latched on correctly, slip your finger into the corner of your baby’s mouth to break the suction and place it between your baby's gums. Attempt to start breastfeeding again. °Signs of successful breastfeeding °Signs from your baby °· Your baby will gradually decrease the number of sucks or will completely stop sucking. °· Your baby will fall asleep. °· Your baby's body will relax. °· Your baby will retain a small amount of milk in his or her mouth. °· Your baby will let go of your breast by himself or herself. °Signs from you °· Breasts that have increased in firmness, weight, and size 1-3 hours after feeding. °· Breasts that are softer immediately after breastfeeding. °· Increased milk volume, as well as a change in milk consistency and color by the fifth day of breastfeeding. °· Nipples that are not sore, cracked, or bleeding. °Signs that your baby is getting enough milk °· Wetting at least 1-2 diapers during the first 24 hours after birth. °· Wetting at least 5-6 diapers every 24 hours for the first week after birth. The urine should be clear or pale yellow by the age of 5 days. °·   Wetting 6-8 diapers every 24 hours as your baby continues to grow and develop. °· At least 3 stools in a 24-hour period by the age of 5 days. The stool should be soft and yellow. °· At least 3 stools in a 24-hour period by the age of 7  days. The stool should be seedy and yellow. °· No loss of weight greater than 10% of birth weight during the first 3 days of life. °· Average weight gain of 4-7 oz (113-198 g) per week after the age of 4 days. °· Consistent daily weight gain by the age of 5 days, without weight loss after the age of 2 weeks. °After a feeding, your baby may spit up a small amount of milk. This is normal. °Breastfeeding frequency and duration °Frequent feeding will help you make more milk and can prevent sore nipples and extremely full breasts (breast engorgement). Breastfeed when you feel the need to reduce the fullness of your breasts or when your baby shows signs of hunger. This is called "breastfeeding on demand." Signs that your baby is hungry include: °· Increased alertness, activity, or restlessness. °· Movement of the head from side to side. °· Opening of the mouth when the corner of the mouth or cheek is stroked (rooting). °· Increased sucking sounds, smacking lips, cooing, sighing, or squeaking. °· Hand-to-mouth movements and sucking on fingers or hands. °· Fussing or crying. °Avoid introducing a pacifier to your baby in the first 4-6 weeks after your baby is born. After this time, you may choose to use a pacifier. Research has shown that pacifier use during the first year of a baby's life decreases the risk of sudden infant death syndrome (SIDS). °Allow your baby to feed on each breast as long as he or she wants. When your baby unlatches or falls asleep while feeding from the first breast, offer the second breast. Because newborns are often sleepy in the first few weeks of life, you may need to awaken your baby to get him or her to feed. °Breastfeeding times will vary from baby to baby. However, the following rules can serve as a guide to help you make sure that your baby is properly fed: °· Newborns (babies 4 weeks of age or younger) may breastfeed every 1-3 hours. °· Newborns should not go without breastfeeding for longer  than 3 hours during the day or 5 hours during the night. °· You should breastfeed your baby a minimum of 8 times in a 24-hour period. °Breast milk pumping ° °  ° °Pumping and storing breast milk allows you to make sure that your baby is exclusively fed your breast milk, even at times when you are unable to breastfeed. This is especially important if you go back to work while you are still breastfeeding, or if you are not able to be present during feedings. Your lactation consultant can help you find a method of pumping that works best for you and give you guidelines about how long it is safe to store breast milk. °Caring for your breasts while you breastfeed °Nipples can become dry, cracked, and sore while breastfeeding. The following recommendations can help keep your breasts moisturized and healthy: °· Avoid using soap on your nipples. °· Wear a supportive bra designed especially for nursing. Avoid wearing underwire-style bras or extremely tight bras (sports bras). °· Air-dry your nipples for 3-4 minutes after each feeding. °· Use only cotton bra pads to absorb leaked breast milk. Leaking of breast milk between feedings is   normal. °· Use lanolin on your nipples after breastfeeding. Lanolin helps to maintain your skin's normal moisture barrier. Pure lanolin is not harmful (not toxic) to your baby. You may also hand express a few drops of breast milk and gently massage that milk into your nipples and allow the milk to air-dry. °In the first few weeks after giving birth, some women experience breast engorgement. Engorgement can make your breasts feel heavy, warm, and tender to the touch. Engorgement peaks within 3-5 days after you give birth. The following recommendations can help to ease engorgement: °· Completely empty your breasts while breastfeeding or pumping. You may want to start by applying warm, moist heat (in the shower or with warm, water-soaked hand towels) just before feeding or pumping. This increases  circulation and helps the milk flow. If your baby does not completely empty your breasts while breastfeeding, pump any extra milk after he or she is finished. °· Apply ice packs to your breasts immediately after breastfeeding or pumping, unless this is too uncomfortable for you. To do this: °? Put ice in a plastic bag. °? Place a towel between your skin and the bag. °? Leave the ice on for 20 minutes, 2-3 times a day. °· Make sure that your baby is latched on and positioned properly while breastfeeding. °If engorgement persists after 48 hours of following these recommendations, contact your health care provider or a lactation consultant. °Overall health care recommendations while breastfeeding °· Eat 3 healthy meals and 3 snacks every day. Well-nourished mothers who are breastfeeding need an additional 450-500 calories a day. You can meet this requirement by increasing the amount of a balanced diet that you eat. °· Drink enough water to keep your urine pale yellow or clear. °· Rest often, relax, and continue to take your prenatal vitamins to prevent fatigue, stress, and low vitamin and mineral levels in your body (nutrient deficiencies). °· Do not use any products that contain nicotine or tobacco, such as cigarettes and e-cigarettes. Your baby may be harmed by chemicals from cigarettes that pass into breast milk and exposure to secondhand smoke. If you need help quitting, ask your health care provider. °· Avoid alcohol. °· Do not use illegal drugs or marijuana. °· Talk with your health care provider before taking any medicines. These include over-the-counter and prescription medicines as well as vitamins and herbal supplements. Some medicines that may be harmful to your baby can pass through breast milk. °· It is possible to become pregnant while breastfeeding. If birth control is desired, ask your health care provider about options that will be safe while breastfeeding your baby. °Where to find more  information: °[employer]: www.llli.org °Contact a health care provider if: °· You feel like you want to stop breastfeeding or have become frustrated with breastfeeding. °· Your nipples are cracked or bleeding. °· Your breasts are red, tender, or warm. °· You have: °? Painful breasts or nipples. °? A swollen area on either breast. °? A fever or chills. °? Nausea or vomiting. °? Drainage other than breast milk from your nipples. °· Your breasts do not become full before feedings by the fifth day after you give birth. °· You feel sad and depressed. °· Your baby is: °? Too sleepy to eat well. °? Having trouble sleeping. °? More than 1 week old and wetting fewer than 6 diapers in a 24-hour period. °? Not gaining weight by 5 days of age. °· Your baby has fewer than 3 stools in a   24-hour period. °· Your baby's skin or the white parts of his or her eyes become yellow. °Get help right away if: °· Your baby is overly tired (lethargic) and does not want to wake up and feed. °· Your baby develops an unexplained fever. °Summary °· Breastfeeding offers many health benefits for infant and mothers. °· Try to breastfeed your infant when he or she shows early signs of hunger. °· Gently tickle or stroke your baby's lips with your finger or nipple to allow the baby to open his or her mouth. Bring the baby to your breast. Make sure that much of the areola is in your baby's mouth. Offer one side and burp the baby before you offer the other side. °· Talk with your health care provider or lactation consultant if you have questions or you face problems as you breastfeed. °This information is not intended to replace advice given to you by your health care provider. Make sure you discuss any questions you have with your health care provider. °Document Released: 11/05/2005 Document Revised: 12/07/2016 Document Reviewed: 12/07/2016 °Elsevier Interactive Patient Education © 2019 [employer]vier Inc. ° °

## 2019-03-25 NOTE — Progress Notes (Signed)
Pt is here for initial prenatal visit, she is late to care. LMP 10/20/18, EDD 07/27/2019

## 2019-03-25 NOTE — Progress Notes (Signed)
  Subjective:    Alexandra Blackburn is a G1P0000 [redacted]w[redacted]d being seen today for her first obstetrical visit.  Her obstetrical history is significant for late onset to care at 22 weks. Patient does intend to breast feed. Pregnancy history fully reviewed.  Patient reports no complaints.  Vitals:   03/25/19 1435  BP: 113/74  Pulse: (!) 112  Weight: 185 lb 14.4 oz (84.3 kg)    HISTORY: OB History  Gravida Para Term Preterm AB Living  1 0 0 0 0 0  SAB TAB Ectopic Multiple Live Births  0 0 0 0 0    # Outcome Date GA Lbr Len/2nd Weight Sex Delivery Anes PTL Lv  1 Current            History reviewed. No pertinent past medical history. Past Surgical History:  Procedure Laterality Date  . WISDOM TOOTH EXTRACTION     Family History  Problem Relation Age of Onset  . Hyperlipidemia Mother   . Heart disease Maternal Grandmother      Exam    Uterus:  Fundal Height: 23 cm  Pelvic Exam:    Perineum: No Hemorrhoids, Normal Perineum   Vulva: normal   Vagina:  normal mucosa, normal discharge   pH:    Cervix: nulliparous appearance   Adnexa: not evaluated   Bony Pelvis: gynecoid  System: Breast:  normal appearance, no masses or tenderness   Skin: normal coloration and turgor, no rashes    Neurologic: oriented, no focal deficits   Extremities: normal strength, tone, and muscle mass   HEENT extra ocular movement intact   Mouth/Teeth mucous membranes moist, pharynx normal without lesions and dental hygiene good   Neck supple and no masses   Cardiovascular: regular rate and rhythm   Respiratory:  appears well, vitals normal, no respiratory distress, acyanotic, normal RR, chest clear, no wheezing, crepitations, rhonchi, normal symmetric air entry   Abdomen: soft, non-tender; bowel sounds normal; no masses,  no organomegaly   Urinary:       Assessment:    Pregnancy: G1P0000 Patient Active Problem List   Diagnosis Date Noted  . Maternal obesity affecting pregnancy, antepartum  03/25/2019  . Supervision of normal pregnancy, antepartum 03/24/2019  . Dysmenorrhea 10/23/2013        Plan:     Initial labs drawn. Prenatal vitamins. Problem list reviewed and updated. Genetic Screening discussed : panorama  ordered.  Ultrasound discussed; fetal survey: ordered. Rx for ASA provided due to maternal obesity  Follow up in 4 weeks for ROB and third trimester labs 50% of 30 min visit spent on counseling and coordination of care.     [name] 03/25/2019

## 2019-03-26 DIAGNOSIS — O26899 Other specified pregnancy related conditions, unspecified trimester: Secondary | ICD-10-CM | POA: Insufficient documentation

## 2019-03-26 DIAGNOSIS — Z6791 Unspecified blood type, Rh negative: Secondary | ICD-10-CM

## 2019-03-26 LAB — OBSTETRIC PANEL, INCLUDING HIV
Antibody Screen: NEGATIVE
Basophils Absolute: 0 10*3/uL (ref 0.0–0.2)
Basos: 0 %
EOS (ABSOLUTE): 0.1 10*3/uL (ref 0.0–0.4)
Eos: 1 %
HIV Screen 4th Generation wRfx: NONREACTIVE
Hematocrit: 32.4 % — ABNORMAL LOW (ref 34.0–46.6)
Hemoglobin: 11.3 g/dL (ref 11.1–15.9)
Hepatitis B Surface Ag: NEGATIVE
Immature Grans (Abs): 0.1 10*3/uL (ref 0.0–0.1)
Immature Granulocytes: 1 %
Lymphocytes Absolute: 2.1 10*3/uL (ref 0.7–3.1)
Lymphs: 24 %
MCH: 30.7 pg (ref 26.6–33.0)
MCHC: 34.9 g/dL (ref 31.5–35.7)
MCV: 88 fL (ref 79–97)
Monocytes Absolute: 0.5 10*3/uL (ref 0.1–0.9)
Monocytes: 6 %
Neutrophils Absolute: 5.9 10*3/uL (ref 1.4–7.0)
Neutrophils: 68 %
Platelets: 292 10*3/uL (ref 150–450)
RBC: 3.68 x10E6/uL — ABNORMAL LOW (ref 3.77–5.28)
RDW: 12.2 % (ref 11.7–15.4)
RPR Ser Ql: NONREACTIVE
Rh Factor: NEGATIVE
Rubella Antibodies, IGG: 1.79 index (ref >0.99)
WBC: 8.5 10*3/uL (ref 3.4–10.8)

## 2019-03-26 LAB — CERVICOVAGINAL ANCILLARY ONLY
Bacterial vaginitis: POSITIVE — AB
Candida vaginitis: POSITIVE — AB
Chlamydia: NEGATIVE
Neisseria Gonorrhea: NEGATIVE
Trichomonas: NEGATIVE

## 2019-03-27 LAB — URINE CULTURE, OB REFLEX

## 2019-03-27 LAB — CULTURE, OB URINE

## 2019-03-27 LAB — CYTOLOGY - PAP
Adequacy: ABSENT
Diagnosis: NEGATIVE
HPV: NOT DETECTED

## 2019-03-31 ENCOUNTER — Other Ambulatory Visit: Payer: Self-pay

## 2019-03-31 ENCOUNTER — Other Ambulatory Visit: Payer: Self-pay | Admitting: Obstetrics and Gynecology

## 2019-03-31 ENCOUNTER — Ambulatory Visit (HOSPITAL_COMMUNITY)
Admission: RE | Admit: 2019-03-31 | Discharge: 2019-03-31 | Disposition: A | Discharge status: Home / Self Care | Payer: Medicaid Other | Source: Ambulatory Visit | Attending: Obstetrics and Gynecology | Admitting: Obstetrics and Gynecology

## 2019-03-31 DIAGNOSIS — Z3490 Encounter for supervision of normal pregnancy, unspecified, unspecified trimester: Secondary | ICD-10-CM

## 2019-03-31 DIAGNOSIS — O99212 Obesity complicating pregnancy, second trimester: Secondary | ICD-10-CM

## 2019-03-31 DIAGNOSIS — O0932 Supervision of pregnancy with insufficient antenatal care, second trimester: Secondary | ICD-10-CM

## 2019-03-31 DIAGNOSIS — Z3A23 23 weeks gestation of pregnancy: Secondary | ICD-10-CM

## 2019-03-31 DIAGNOSIS — Z363 Encounter for antenatal screening for malformations: Secondary | ICD-10-CM

## 2019-04-01 ENCOUNTER — Encounter: Payer: Self-pay | Admitting: Obstetrics and Gynecology

## 2019-04-02 ENCOUNTER — Encounter: Payer: Self-pay | Admitting: Obstetrics and Gynecology

## 2019-04-03 MED ORDER — TERCONAZOLE 0.8 % VA CREA: 1.0000 | TOPICAL_CREAM | Freq: Every day | VAGINAL | 0 refills | Status: DC

## 2019-04-03 NOTE — Addendum Note (Signed)
Addended by: Catalina Antigua on: 04/03/2019 11:35 AM   Modules accepted: Orders

## 2019-04-07 ENCOUNTER — Encounter: Payer: BC Managed Care – PPO | Admitting: Women's Health

## 2019-04-07 ENCOUNTER — Encounter: Payer: Self-pay | Admitting: Family Medicine

## 2019-04-07 DIAGNOSIS — O0930 Supervision of pregnancy with insufficient antenatal care, unspecified trimester: Secondary | ICD-10-CM | POA: Insufficient documentation

## 2019-04-22 ENCOUNTER — Other Ambulatory Visit: Payer: Self-pay

## 2019-04-22 ENCOUNTER — Other Ambulatory Visit: Payer: 59

## 2019-04-22 ENCOUNTER — Ambulatory Visit (INDEPENDENT_AMBULATORY_CARE_PROVIDER_SITE_OTHER): Payer: 59 | Admitting: Obstetrics & Gynecology

## 2019-04-22 VITALS — BP 105/72 | HR 101 | Wt 185.9 lb

## 2019-04-22 DIAGNOSIS — Z3490 Encounter for supervision of normal pregnancy, unspecified, unspecified trimester: Secondary | ICD-10-CM

## 2019-04-22 DIAGNOSIS — Z3A26 26 weeks gestation of pregnancy: Secondary | ICD-10-CM

## 2019-04-22 DIAGNOSIS — O26892 Other specified pregnancy related conditions, second trimester: Secondary | ICD-10-CM

## 2019-04-22 DIAGNOSIS — Z6791 Unspecified blood type, Rh negative: Secondary | ICD-10-CM

## 2019-04-22 NOTE — Progress Notes (Signed)
   PRENATAL VISIT NOTE  Subjective:  Alexandra Blackburn is a 23 y.o. G1P0000 at [redacted]w[redacted]d being seen today for ongoing prenatal care.  She is currently monitored for the following issues for this low-risk pregnancy and has Dysmenorrhea; Supervision of normal pregnancy, antepartum; Maternal obesity affecting pregnancy, antepartum; Rh negative status during pregnancy; and Late prenatal care affecting pregnancy, antepartum on their problem list.  Patient reports no complaints.  Contractions: Not present. Vag. Bleeding: None.  Movement: Present. Denies leaking of fluid.   The following portions of the patient's history were reviewed and updated as appropriate: allergies, current medications, past family history, past medical history, past social history, past surgical history and problem list.   Objective:   Vitals:   04/22/19 0832  BP: 105/72  Pulse: (!) 101  Weight: 185 lb 14.4 oz (84.3 kg)    Fetal Status: Fetal Heart Rate (bpm): 140   Movement: Present     General:  Alert, oriented and cooperative. Patient is in no acute distress.  Skin: Skin is warm and dry. No rash noted.   Cardiovascular: Normal heart rate noted  Respiratory: Normal respiratory effort, no problems with respiration noted  Abdomen: Soft, gravid, appropriate for gestational age.  Pain/Pressure: Absent     Pelvic: Cervical exam deferred        Extremities: Normal range of motion.  Edema: None  Mental Status: Normal mood and affect. Normal behavior. Normal judgment and thought content.   Assessment and Plan:  Pregnancy: G1P0000 at [redacted]w[redacted]d 1. Encounter for supervision of normal pregnancy, antepartum, unspecified gravidity Routine labs - Glucose Tolerance, 2 Hours w/1 Hour - RPR - CBC - HIV antibody (with reflex)  2. Rh negative status during pregnancy in second trimester Rhogam 2 weeks - Hemoglobinopathy evaluation  Preterm labor symptoms and general obstetric precautions including but not limited to vaginal bleeding,  contractions, leaking of fluid and fetal movement were reviewed in detail with the patient. Please refer to After Visit Summary for other counseling recommendations.   Return in about 2 weeks (around 05/06/2019) for Rhogam.  No future appointments.  Scheryl Darter, MD

## 2019-04-22 NOTE — Progress Notes (Signed)
Patient reports fetal movement, denies pain. 

## 2019-04-23 LAB — CBC
Hematocrit: 33.2 % — ABNORMAL LOW (ref 34.0–46.6)
Hemoglobin: 11.1 g/dL (ref 11.1–15.9)
MCH: 29.8 pg (ref 26.6–33.0)
MCHC: 33.4 g/dL (ref 31.5–35.7)
MCV: 89 fL (ref 79–97)
Platelets: 274 10*3/uL (ref 150–450)
RBC: 3.73 x10E6/uL — ABNORMAL LOW (ref 3.77–5.28)
RDW: 12.5 % (ref 11.7–15.4)
WBC: 7.1 10*3/uL (ref 3.4–10.8)

## 2019-04-23 LAB — GLUCOSE TOLERANCE, 2 HOURS W/ 1HR
Glucose, 1 hour: 81 mg/dL (ref 65–179)
Glucose, 2 hour: 71 mg/dL (ref 65–152)
Glucose, Fasting: 74 mg/dL (ref 65–91)

## 2019-04-23 LAB — RPR: RPR Ser Ql: NONREACTIVE

## 2019-04-23 LAB — HIV ANTIBODY (ROUTINE TESTING W REFLEX): HIV Screen 4th Generation wRfx: NONREACTIVE

## 2019-05-06 ENCOUNTER — Other Ambulatory Visit: Payer: Self-pay

## 2019-05-06 ENCOUNTER — Ambulatory Visit (INDEPENDENT_AMBULATORY_CARE_PROVIDER_SITE_OTHER): Payer: 59 | Admitting: Obstetrics & Gynecology

## 2019-05-06 ENCOUNTER — Encounter: Payer: Self-pay | Admitting: Obstetrics & Gynecology

## 2019-05-06 VITALS — BP 102/61 | HR 90 | Temp 97.2°F | Wt 189.0 lb

## 2019-05-06 DIAGNOSIS — O26892 Other specified pregnancy related conditions, second trimester: Secondary | ICD-10-CM

## 2019-05-06 DIAGNOSIS — O360120 Maternal care for anti-D [Rh] antibodies, second trimester, not applicable or unspecified: Secondary | ICD-10-CM | POA: Diagnosis not present

## 2019-05-06 DIAGNOSIS — Z6791 Unspecified blood type, Rh negative: Secondary | ICD-10-CM

## 2019-05-06 DIAGNOSIS — Z3490 Encounter for supervision of normal pregnancy, unspecified, unspecified trimester: Secondary | ICD-10-CM

## 2019-05-06 DIAGNOSIS — Z3A28 28 weeks gestation of pregnancy: Secondary | ICD-10-CM

## 2019-05-06 MED ORDER — RHO D IMMUNE GLOBULIN 1500 UNIT/2ML IJ SOSY
300.0000 ug | PREFILLED_SYRINGE | Freq: Once | INTRAMUSCULAR | Status: AC
  Administered 2019-05-06: 300 ug via INTRAMUSCULAR

## 2019-05-06 NOTE — Progress Notes (Signed)
ROB Rhogam due today.

## 2019-05-06 NOTE — Patient Instructions (Signed)

## 2019-05-06 NOTE — Progress Notes (Signed)
Pt wants has questions regarding travel restrictions

## 2019-05-06 NOTE — Progress Notes (Signed)
   PRENATAL VISIT NOTE  Subjective:  Alexandra Blackburn is a 23 y.o. G1P0000 at [redacted]w[redacted]d being seen today for ongoing prenatal care.  She is currently monitored for the following issues for this low-risk pregnancy and has Dysmenorrhea; Supervision of normal pregnancy, antepartum; Maternal obesity affecting pregnancy, antepartum; Rh negative status during pregnancy; and Late prenatal care affecting pregnancy, antepartum on their problem list.  Patient reports no complaints.  Contractions: Not present. Vag. Bleeding: None.  Movement: Present. Denies leaking of fluid.   The following portions of the patient's history were reviewed and updated as appropriate: allergies, current medications, past family history, past medical history, past social history, past surgical history and problem list.   Objective:   Vitals:   05/06/19 1538  BP: 102/61  Pulse: 90  Temp: (!) 97.2 F (36.2 C)  Weight: 189 lb (85.7 kg)    Fetal Status: Fetal Heart Rate (bpm): 158   Movement: Present     General:  Alert, oriented and cooperative. Patient is in no acute distress.  Skin: Skin is warm and dry. No rash noted.   Cardiovascular: Normal heart rate noted  Respiratory: Normal respiratory effort, no problems with respiration noted  Abdomen: Soft, gravid, appropriate for gestational age.  Pain/Pressure: Absent     Pelvic: Cervical exam deferred        Extremities: Normal range of motion.  Edema: None  Mental Status: Normal mood and affect. Normal behavior. Normal judgment and thought content.   Assessment and Plan:  Pregnancy: G1P0000 at [redacted]w[redacted]d 1. Rh negative status during pregnancy in second trimester  - rho (d) immune globulin (RHIG/RHOPHYLAC) injection 300 mcg  2. Encounter for supervision of normal pregnancy, antepartum, unspecified gravidity 2 hr GTT was nl  Preterm labor symptoms and general obstetric precautions including but not limited to vaginal bleeding, contractions, leaking of fluid and fetal  movement were reviewed in detail with the patient. Please refer to After Visit Summary for other counseling recommendations.   Return in about 4 weeks (around 06/03/2019) for virtual.  No future appointments.  Emeterio Reeve, MD

## 2019-06-03 ENCOUNTER — Telehealth: Payer: 59 | Admitting: Obstetrics & Gynecology

## 2019-06-04 ENCOUNTER — Telehealth (INDEPENDENT_AMBULATORY_CARE_PROVIDER_SITE_OTHER): Payer: 59 | Admitting: Obstetrics

## 2019-06-04 ENCOUNTER — Encounter: Payer: Self-pay | Admitting: Obstetrics

## 2019-06-04 DIAGNOSIS — O99210 Obesity complicating pregnancy, unspecified trimester: Secondary | ICD-10-CM

## 2019-06-04 DIAGNOSIS — O0933 Supervision of pregnancy with insufficient antenatal care, third trimester: Secondary | ICD-10-CM

## 2019-06-04 DIAGNOSIS — O0930 Supervision of pregnancy with insufficient antenatal care, unspecified trimester: Secondary | ICD-10-CM

## 2019-06-04 DIAGNOSIS — Z3480 Encounter for supervision of other normal pregnancy, unspecified trimester: Secondary | ICD-10-CM

## 2019-06-04 DIAGNOSIS — Z6791 Unspecified blood type, Rh negative: Secondary | ICD-10-CM

## 2019-06-04 DIAGNOSIS — Z3A32 32 weeks gestation of pregnancy: Secondary | ICD-10-CM

## 2019-06-04 DIAGNOSIS — O99213 Obesity complicating pregnancy, third trimester: Secondary | ICD-10-CM

## 2019-06-04 DIAGNOSIS — O26893 Other specified pregnancy related conditions, third trimester: Secondary | ICD-10-CM

## 2019-06-04 NOTE — Progress Notes (Signed)
I connected with Alexandra Blackburn on 06/04/19 at  3:30 PM EDT by telephone and verified that I am speaking with the correct person using two identifiers.  Pt c/o 'tailbone' pain radiating down to L leg.

## 2019-06-04 NOTE — Progress Notes (Signed)
   [place] VIRTUAL VIDEO VISIT ENCOUNTER NOTE  Provider location: [facility] at [place]   I connected with Alexandra Blackburn on 06/04/19 at  3:30 PM EDT by WebEx OB MyChart Video Encounter at home and verified that I am speaking with the correct person using two identifiers.   I discussed the limitations, risks, security and privacy concerns of performing an evaluation and management service virtually and the availability of in person appointments. I also discussed with the patient that there may be a patient responsible charge related to this service. The patient expressed understanding and agreed to proceed. Subjective:  Alexandra Blackburn is a 23 y.o. G1P0000 at [redacted]w[redacted]d being seen today for ongoing prenatal care.  She is currently monitored for the following issues for this low-risk pregnancy and has Dysmenorrhea; Supervision of normal pregnancy, antepartum; Maternal obesity affecting pregnancy, antepartum; Rh negative status during pregnancy; and Late prenatal care affecting pregnancy, antepartum on their problem list.  Patient reports backache.  Contractions: Not present. Vag. Bleeding: None.  Movement: Present. Denies any leaking of fluid.   The following portions of the patient's history were reviewed and updated as appropriate: allergies, current medications, past family history, past medical history, past social history, past surgical history and problem list.   Objective:   Vitals:   06/04/19 1513  BP: 106/67  Pulse: 99    Fetal Status:     Movement: Present     General:  Alert, oriented and cooperative. Patient is in no acute distress.  Respiratory: Normal respiratory effort, no problems with respiration noted  Mental Status: Normal mood and affect. Normal behavior. Normal judgment and thought content.  Rest of physical exam deferred due to type of encounter  Imaging: No results found.  Assessment and Plan:  Pregnancy: G1P0000 at [redacted]w[redacted]d  1. Late prenatal care affecting pregnancy, antepartum  2. Rh negative status during pregnancy in third trimester - Rhogam postpartum  3. Maternal obesity affecting pregnancy, antepartum  4. Supervision of other normal pregnancy, antepartum   Preterm labor symptoms and general obstetric precautions including but not limited to vaginal bleeding, contractions, leaking of fluid and fetal movement were reviewed in detail with the patient. I discussed the assessment and treatment plan with the patient. The patient was provided an opportunity to ask questions and all were answered. The patient agreed with the plan and demonstrated an understanding of the instructions. The patient was advised to call back or seek an in-person office evaluation/go to MAU at [facility] for any urgent or concerning symptoms. Please refer to After Visit Summary for other counseling recommendations.   I provided 10 minutes of face-to-face time during this encounter.  Return in about 2 weeks (around 06/18/2019) for MyChart.    Baltazar Najjar, MD [facility], [place] [facility] 06-04-2019

## 2019-07-02 ENCOUNTER — Ambulatory Visit (INDEPENDENT_AMBULATORY_CARE_PROVIDER_SITE_OTHER): Payer: 59 | Admitting: Certified Nurse Midwife

## 2019-07-02 ENCOUNTER — Encounter: Payer: Self-pay | Admitting: Certified Nurse Midwife

## 2019-07-02 ENCOUNTER — Other Ambulatory Visit: Payer: Self-pay

## 2019-07-02 ENCOUNTER — Other Ambulatory Visit (HOSPITAL_COMMUNITY)
Admission: RE | Admit: 2019-07-02 | Discharge: 2019-07-02 | Disposition: A | Discharge status: Home / Self Care | Payer: 59 | Source: Ambulatory Visit | Attending: Certified Nurse Midwife | Admitting: Certified Nurse Midwife

## 2019-07-02 VITALS — BP 115/73 | HR 87 | Wt 194.0 lb

## 2019-07-02 DIAGNOSIS — Z3400 Encounter for supervision of normal first pregnancy, unspecified trimester: Secondary | ICD-10-CM | POA: Diagnosis present

## 2019-07-02 DIAGNOSIS — O26893 Other specified pregnancy related conditions, third trimester: Secondary | ICD-10-CM

## 2019-07-02 DIAGNOSIS — Z23 Encounter for immunization: Secondary | ICD-10-CM

## 2019-07-02 DIAGNOSIS — Z6791 Unspecified blood type, Rh negative: Secondary | ICD-10-CM

## 2019-07-02 DIAGNOSIS — Z3A36 36 weeks gestation of pregnancy: Secondary | ICD-10-CM

## 2019-07-02 NOTE — Progress Notes (Signed)
ROB/GBS.  TDAP given in RD, tolerated well.

## 2019-07-02 NOTE — Patient Instructions (Addendum)
Reasons to go to MAU:  1.  Contractions are  3-4 minutes apart or less, each last 1 minute, these have been going on for 1-2 hours, and you cannot walk or talk during them 2.  You have a large gush of fluid, or a trickle of fluid that will not stop and you have to wear a pad 3.  You have bleeding that is bright red, heavier than spotting--like menstrual bleeding (spotting can be normal in early labor or after a check of your cervix) 4.  You do not feel the baby moving like he/she normally does   Cervical Ripening: May try one or both  Red Raspberry Leaf capsules:  two 300mg  or 400mg  tablets with each meal, 2-3 times a day  Potential Side Effects Of Raspberry Leaf:  Most women do not experience any side effects from drinking raspberry leaf tea. However, nausea and loose stools are possible   Evening Primrose Oil capsules: may take 1 to 3 capsules daily. May also prick one to release the oil and insert it into your vagina at night.  Some of the potential side effects:  Upset stomach  Loose stools or diarrhea  Headaches  Nausea:

## 2019-07-02 NOTE — Progress Notes (Signed)
   PRENATAL VISIT NOTE  Subjective:  Alexandra Blackburn is a 23 y.o. G1P0000 at [redacted]w[redacted]d being seen today for ongoing prenatal care.  She is currently monitored for the following issues for this low-risk pregnancy and has Dysmenorrhea; Supervision of normal pregnancy, antepartum; Maternal obesity affecting pregnancy, antepartum; Rh negative status during pregnancy; and Late prenatal care affecting pregnancy, antepartum on their problem list.  Patient reports no complaints.  Contractions: Not present. Vag. Bleeding: None.  Movement: Present. Denies leaking of fluid.   The following portions of the patient's history were reviewed and updated as appropriate: allergies, current medications, past family history, past medical history, past social history, past surgical history and problem list.   Objective:   Vitals:   07/02/19 0938  BP: 115/73  Pulse: 87  Weight: 194 lb (88 kg)    Fetal Status: Fetal Heart Rate (bpm): 152 Fundal Height: 36 cm Movement: Present  Presentation: Vertex  General:  Alert, oriented and cooperative. Patient is in no acute distress.  Skin: Skin is warm and dry. No rash noted.   Cardiovascular: Normal heart rate noted  Respiratory: Normal respiratory effort, no problems with respiration noted  Abdomen: Soft, gravid, appropriate for gestational age.  Pain/Pressure: Present     Pelvic: Cervical exam performed Dilation: Closed Effacement (%): Thick Station: -3  Extremities: Normal range of motion.  Edema: None  Mental Status: Normal mood and affect. Normal behavior. Normal judgment and thought content.   Assessment and Plan:  Pregnancy: G1P0000 at [redacted]w[redacted]d 1. Supervision of normal first pregnancy, antepartum - Patient doing well, no complaints  - Anticipatory guidance on upcoming appointments with virtual at 38 weeks and in person at 39 weeks  - Educated and discussed reasons to present to the hospital for evaluation  - Discussed use of EPO, RRT and IC starting at 37 weeks   - COVID 19 precautions  - Cervicovaginal ancillary only( [place]) - Strep Gp B NAA  2. Rh negative, antepartum - Rhogam PP as needed   Preterm labor symptoms and general obstetric precautions including but not limited to vaginal bleeding, contractions, leaking of fluid and fetal movement were reviewed in detail with the patient. Please refer to After Visit Summary for other counseling recommendations.   Return in about 2 weeks (around 07/16/2019) for ROB-mychart.  Future Appointments  Date Time Provider [place]  07/16/2019  9:15 AM Shelly Bombard, MD [place] None    Lajean Manes, CNM

## 2019-07-03 LAB — CERVICOVAGINAL ANCILLARY ONLY
Chlamydia: NEGATIVE
Neisseria Gonorrhea: NEGATIVE
Trichomonas: NEGATIVE

## 2019-07-04 LAB — STREP GP B NAA: Strep Gp B NAA: NEGATIVE

## 2019-07-16 ENCOUNTER — Telehealth (INDEPENDENT_AMBULATORY_CARE_PROVIDER_SITE_OTHER): Payer: 59 | Admitting: Obstetrics

## 2019-07-16 ENCOUNTER — Encounter: Payer: Self-pay | Admitting: Obstetrics

## 2019-07-16 DIAGNOSIS — O99210 Obesity complicating pregnancy, unspecified trimester: Secondary | ICD-10-CM

## 2019-07-16 DIAGNOSIS — Z3400 Encounter for supervision of normal first pregnancy, unspecified trimester: Secondary | ICD-10-CM

## 2019-07-16 DIAGNOSIS — O360130 Maternal care for anti-D [Rh] antibodies, third trimester, not applicable or unspecified: Secondary | ICD-10-CM

## 2019-07-16 DIAGNOSIS — O99213 Obesity complicating pregnancy, third trimester: Secondary | ICD-10-CM

## 2019-07-16 DIAGNOSIS — O0933 Supervision of pregnancy with insufficient antenatal care, third trimester: Secondary | ICD-10-CM

## 2019-07-16 DIAGNOSIS — Z3A38 38 weeks gestation of pregnancy: Secondary | ICD-10-CM

## 2019-07-16 DIAGNOSIS — Z6791 Unspecified blood type, Rh negative: Secondary | ICD-10-CM

## 2019-07-16 DIAGNOSIS — O0930 Supervision of pregnancy with insufficient antenatal care, unspecified trimester: Secondary | ICD-10-CM

## 2019-07-16 NOTE — Progress Notes (Signed)
I connected with Alexandra Blackburn on 07/16/19 at  9:15 AM EDTby telephone and verified that I am speaking with the correct person using two identifiers.

## 2019-07-16 NOTE — Progress Notes (Signed)
   [place] VIRTUAL VIDEO VISIT ENCOUNTER NOTE  Provider location: [facility] at [place]   I connected with Alexandra Blackburn on 07/16/19 at  9:15 AM EDT by WebEx OB MyChart Video Encounter at home and verified that I am speaking with the correct person using two identifiers.   I discussed the limitations, risks, security and privacy concerns of performing an evaluation and management service virtually and the availability of in person appointments. I also discussed with the patient that there may be a patient responsible charge related to this service. The patient expressed understanding and agreed to proceed. Subjective:  Alexandra Blackburn is a 23 y.o. G1P0000 at [redacted]w[redacted]d being seen today for ongoing prenatal care.  She is currently monitored for the following issues for this low-risk pregnancy and has Dysmenorrhea; Supervision of normal pregnancy, antepartum; Maternal obesity affecting pregnancy, antepartum; Rh negative status during pregnancy; and Late prenatal care affecting pregnancy, antepartum on their problem list.  Patient reports no complaints.  Contractions: Not present. Vag. Bleeding: None.  Movement: Present. Denies any leaking of fluid.   The following portions of the patient's history were reviewed and updated as appropriate: allergies, current medications, past family history, past medical history, past social history, past surgical history and problem list.   Objective:   Vitals:   07/16/19 0905  BP: 114/74  Pulse: 93  Weight: 191 lb 9.6 oz (86.9 kg)    Fetal Status:     Movement: Present     General:  Alert, oriented and cooperative. Patient is in no acute distress.  Respiratory: Normal respiratory effort, no problems with respiration noted  Mental Status: Normal mood and affect. Normal behavior. Normal judgment and thought content.  Rest of physical exam deferred due to type of encounter  Imaging: No results found.  Assessment  and Plan:  Pregnancy: G1P0000 at [redacted]w[redacted]d 1. Supervision of normal first pregnancy, antepartum  2. Late prenatal care affecting pregnancy, antepartum  3. Rh negative status during pregnancy in third trimester - Rhogam postpartum  4. Maternal obesity affecting pregnancy, antepartum   Term labor symptoms and general obstetric precautions including but not limited to vaginal bleeding, contractions, leaking of fluid and fetal movement were reviewed in detail with the patient. I discussed the assessment and treatment plan with the patient. The patient was provided an opportunity to ask questions and all were answered. The patient agreed with the plan and demonstrated an understanding of the instructions. The patient was advised to call back or seek an in-person office evaluation/go to MAU at [facility] for any urgent or concerning symptoms. Please refer to After Visit Summary for other counseling recommendations.   I provided 10 minutes of face-to-face time during this encounter.  Return in about 1 week (around 07/23/2019) for ROB in person.   Baltazar Najjar, MD [facility] for [facility], [place] [facility] 07/16/2019

## 2019-07-23 ENCOUNTER — Ambulatory Visit (INDEPENDENT_AMBULATORY_CARE_PROVIDER_SITE_OTHER): Payer: 59 | Admitting: Certified Nurse Midwife

## 2019-07-23 ENCOUNTER — Encounter: Payer: Self-pay | Admitting: Certified Nurse Midwife

## 2019-07-23 ENCOUNTER — Other Ambulatory Visit: Payer: Self-pay

## 2019-07-23 VITALS — BP 107/72 | HR 112 | Wt 192.0 lb

## 2019-07-23 DIAGNOSIS — Z3A39 39 weeks gestation of pregnancy: Secondary | ICD-10-CM

## 2019-07-23 DIAGNOSIS — O360930 Maternal care for other rhesus isoimmunization, third trimester, not applicable or unspecified: Secondary | ICD-10-CM

## 2019-07-23 DIAGNOSIS — Z3400 Encounter for supervision of normal first pregnancy, unspecified trimester: Secondary | ICD-10-CM

## 2019-07-23 DIAGNOSIS — O26893 Other specified pregnancy related conditions, third trimester: Secondary | ICD-10-CM

## 2019-07-23 DIAGNOSIS — Z6791 Unspecified blood type, Rh negative: Secondary | ICD-10-CM

## 2019-07-23 NOTE — Patient Instructions (Signed)
  Cervical Ripening: May try one or both  Red Raspberry Leaf capsules:  two 300mg  or 400mg  tablets with each meal, 2-3 times a day  Potential Side Effects Of Raspberry Leaf:  Most women do not experience any side effects from drinking raspberry leaf tea. However, nausea and loose stools are possible     Evening Primrose Oil capsules: may take 1 to 3 capsules daily. May also prick one to release the oil and insert it into your vagina at night.  Some of the potential side effects:  Upset stomach  Loose stools or diarrhea  Headaches  Nausea:    Reasons to return to MAU:  1.  Contractions are  5 minutes apart or less, each last 1 minute, these have been going on for 1-2 hours, and you cannot walk or talk during them 2.  You have a large gush of fluid, or a trickle of fluid that will not stop and you have to wear a pad 3.  You have bleeding that is bright red, heavier than spotting--like menstrual bleeding (spotting can be normal in early labor or after a check of your cervix) 4.  You do not feel the baby moving like he/she normally does

## 2019-07-23 NOTE — Progress Notes (Signed)
ROB request cervix check today

## 2019-07-23 NOTE — Progress Notes (Addendum)
   PRENATAL VISIT NOTE  Subjective:  Alexandra Blackburn is a 23 y.o. G1P0000 at [redacted]w[redacted]d being seen today for ongoing prenatal care.  She is currently monitored for the following issues for this low-risk pregnancy and has Dysmenorrhea; Supervision of normal pregnancy, antepartum; Maternal obesity affecting pregnancy, antepartum; Rh negative status during pregnancy; and Late prenatal care affecting pregnancy, antepartum on their problem list.  Patient reports no complaints.  Contractions: Not present. Vag. Bleeding: None.  Movement: Present. Denies leaking of fluid.   The following portions of the patient's history were reviewed and updated as appropriate: allergies, current medications, past family history, past medical history, past social history, past surgical history and problem list.   Objective:   Vitals:   07/23/19 1612  BP: 107/72  Pulse: (!) 112  Weight: 192 lb (87.1 kg)    Fetal Status: Fetal Heart Rate (bpm): 146 Fundal Height: 36 cm Movement: Present  Presentation: Vertex  General:  Alert, oriented and cooperative. Patient is in no acute distress.  Skin: Skin is warm and dry. No rash noted.   Cardiovascular: Normal heart rate noted  Respiratory: Normal respiratory effort, no problems with respiration noted  Abdomen: Soft, gravid, appropriate for gestational age.  Pain/Pressure: Present     Pelvic: Cervical exam performed Dilation: Closed Effacement (%): Thick Station: -3  Extremities: Normal range of motion.  Edema: None  Mental Status: Normal mood and affect. Normal behavior. Normal judgment and thought content.   Assessment and Plan:  Pregnancy: G1P0000 at [redacted]w[redacted]d 1. Supervision of normal first pregnancy, antepartum - patient doing well, tired and wants pregnancy over  - Routine prenatal care  - Anticipatory guidance on upcoming appointments with antenatal screening next week d/t postdates  - Patient is currently taking 1 EPO tablet, encouraged to increase EPO use and add  RRT and dates, discussed IC to induce contractions and help with cervical ripening  - IOL scheduled for 9/14- orders placed  Reviewed safety, visitor policy, reassurance about COVID-19 for pregnancy at this time. Discussed possible changes to visits, including televisits, that may occur due to COVID-19.  The office remains open if pt needs to be seen and MAU is open 24 hours/day for OB emergencies.  2. Rh negative status during pregnancy in third trimester - Rhogam PP   Term labor symptoms and general obstetric precautions including but not limited to vaginal bleeding, contractions, leaking of fluid and fetal movement were reviewed in detail with the patient. Please refer to After Visit Summary for other counseling recommendations.   Return in about 5 days (around 07/28/2019) for ROB, NST.  Lajean Manes, CNM

## 2019-07-27 ENCOUNTER — Other Ambulatory Visit: Payer: Self-pay | Admitting: Advanced Practice Midwife

## 2019-07-28 ENCOUNTER — Telehealth (HOSPITAL_COMMUNITY): Payer: Self-pay | Admitting: *Deleted

## 2019-07-28 ENCOUNTER — Encounter: Payer: Self-pay | Admitting: Obstetrics and Gynecology

## 2019-07-28 ENCOUNTER — Ambulatory Visit (INDEPENDENT_AMBULATORY_CARE_PROVIDER_SITE_OTHER): Payer: 59 | Admitting: Obstetrics and Gynecology

## 2019-07-28 ENCOUNTER — Encounter (HOSPITAL_COMMUNITY): Payer: Self-pay | Admitting: *Deleted

## 2019-07-28 ENCOUNTER — Other Ambulatory Visit: Payer: Self-pay

## 2019-07-28 VITALS — BP 102/69 | HR 110 | Wt 194.3 lb

## 2019-07-28 DIAGNOSIS — Z3481 Encounter for supervision of other normal pregnancy, first trimester: Secondary | ICD-10-CM

## 2019-07-28 DIAGNOSIS — O26893 Other specified pregnancy related conditions, third trimester: Secondary | ICD-10-CM

## 2019-07-28 DIAGNOSIS — Z3A40 40 weeks gestation of pregnancy: Secondary | ICD-10-CM

## 2019-07-28 DIAGNOSIS — O0933 Supervision of pregnancy with insufficient antenatal care, third trimester: Secondary | ICD-10-CM | POA: Diagnosis not present

## 2019-07-28 DIAGNOSIS — O99213 Obesity complicating pregnancy, third trimester: Secondary | ICD-10-CM

## 2019-07-28 DIAGNOSIS — Z6791 Unspecified blood type, Rh negative: Secondary | ICD-10-CM

## 2019-07-28 DIAGNOSIS — Z3400 Encounter for supervision of normal first pregnancy, unspecified trimester: Secondary | ICD-10-CM

## 2019-07-28 DIAGNOSIS — O0930 Supervision of pregnancy with insufficient antenatal care, unspecified trimester: Secondary | ICD-10-CM

## 2019-07-28 DIAGNOSIS — O99210 Obesity complicating pregnancy, unspecified trimester: Secondary | ICD-10-CM

## 2019-07-28 NOTE — Telephone Encounter (Signed)
Preadmission screen  

## 2019-07-28 NOTE — Progress Notes (Signed)
Postdates ROB/NST.  Reports no problems today.

## 2019-07-28 NOTE — Progress Notes (Signed)
   PRENATAL VISIT NOTE  Subjective:  Alexandra Blackburn is a 23 y.o. G1P0000 at [redacted]w[redacted]d being seen today for ongoing prenatal care.  She is currently monitored for the following issues for this low-risk pregnancy and has Dysmenorrhea; Supervision of normal pregnancy, antepartum; Maternal obesity affecting pregnancy, antepartum; Rh negative status during pregnancy; and Late prenatal care affecting pregnancy, antepartum on their problem list.  Patient reports no complaints.  Contractions: Not present. Vag. Bleeding: None.  Movement: Present. Denies leaking of fluid.   The following portions of the patient's history were reviewed and updated as appropriate: allergies, current medications, past family history, past medical history, past social history, past surgical history and problem list.   Objective:   Vitals:   07/28/19 1025  BP: 102/69  Pulse: (!) 110  Weight: 194 lb 4.8 oz (88.1 kg)    Fetal Status: Fetal Heart Rate (bpm): NST Fundal Height: 40 cm Movement: Present  Presentation: Vertex  General:  Alert, oriented and cooperative. Patient is in no acute distress.  Skin: Skin is warm and dry. No rash noted.   Cardiovascular: Normal heart rate noted  Respiratory: Normal respiratory effort, no problems with respiration noted  Abdomen: Soft, gravid, appropriate for gestational age.  Pain/Pressure: Present     Pelvic: Cervical exam performed Dilation: Closed Effacement (%): Thick Station: -3  Extremities: Normal range of motion.  Edema: None  Mental Status: Normal mood and affect. Normal behavior. Normal judgment and thought content.   Assessment and Plan:  Pregnancy: G1P0000 at [redacted]w[redacted]d 1. Supervision of normal first pregnancy, antepartum Patient is doing well without complaints Postdate testing today NST reviewed and reactive with baseline 135, mod variability, +accels 150, no decels Patient scheduled for IOL on 9/14  2. Maternal obesity affecting pregnancy, antepartum   3. Rh  negative status during pregnancy in third trimester S/p rhogam  4. Late prenatal care affecting pregnancy, antepartum   Term labor symptoms and general obstetric precautions including but not limited to vaginal bleeding, contractions, leaking of fluid and fetal movement were reviewed in detail with the patient. Please refer to After Visit Summary for other counseling recommendations.   No follow-ups on file.  Future Appointments  Date Time Provider [place]  08/01/2019  7:30 AM [facility]-MAU 1 [facility]INDC None  08/03/2019  8:00 AM [facility]-LD SCHED ROOM [facility] None    Mora Bellman, MD

## 2019-07-31 ENCOUNTER — Other Ambulatory Visit: Payer: Self-pay | Admitting: Advanced Practice Midwife

## 2019-08-01 ENCOUNTER — Other Ambulatory Visit: Payer: Self-pay

## 2019-08-01 ENCOUNTER — Inpatient Hospital Stay (HOSPITAL_COMMUNITY)
Admission: AD | Admit: 2019-08-01 | Discharge: 2019-08-04 | DRG: 807 | Disposition: A | Discharge status: Home / Self Care | Payer: 59 | Attending: Family Medicine | Admitting: Family Medicine

## 2019-08-01 ENCOUNTER — Other Ambulatory Visit (HOSPITAL_COMMUNITY)
Admission: RE | Admit: 2019-08-01 | Discharge: 2019-08-01 | Disposition: A | Discharge status: Home / Self Care | Payer: 59 | Source: Ambulatory Visit

## 2019-08-01 ENCOUNTER — Encounter (HOSPITAL_COMMUNITY): Payer: Self-pay | Admitting: *Deleted

## 2019-08-01 DIAGNOSIS — O99214 Obesity complicating childbirth: Secondary | ICD-10-CM | POA: Diagnosis present

## 2019-08-01 DIAGNOSIS — O26893 Other specified pregnancy related conditions, third trimester: Secondary | ICD-10-CM | POA: Diagnosis present

## 2019-08-01 DIAGNOSIS — Z30430 Encounter for insertion of intrauterine contraceptive device: Secondary | ICD-10-CM | POA: Diagnosis not present

## 2019-08-01 DIAGNOSIS — Z20828 Contact with and (suspected) exposure to other viral communicable diseases: Secondary | ICD-10-CM | POA: Diagnosis present

## 2019-08-01 DIAGNOSIS — Z6791 Unspecified blood type, Rh negative: Secondary | ICD-10-CM | POA: Diagnosis not present

## 2019-08-01 DIAGNOSIS — Z3A40 40 weeks gestation of pregnancy: Secondary | ICD-10-CM | POA: Diagnosis not present

## 2019-08-01 DIAGNOSIS — E669 Obesity, unspecified: Secondary | ICD-10-CM | POA: Diagnosis present

## 2019-08-01 DIAGNOSIS — O480 Post-term pregnancy: Secondary | ICD-10-CM | POA: Diagnosis not present

## 2019-08-01 LAB — CBC
HCT: 38.9 % (ref 36.0–46.0)
Hemoglobin: 12.9 g/dL (ref 12.0–15.0)
MCH: 29.1 pg (ref 26.0–34.0)
MCHC: 33.2 g/dL (ref 30.0–36.0)
MCV: 87.8 fL (ref 80.0–100.0)
Platelets: 277 10*3/uL (ref 150–400)
RBC: 4.43 MIL/uL (ref 3.87–5.11)
RDW: 14.1 % (ref 11.5–15.5)
WBC: 8.9 10*3/uL (ref 4.0–10.5)
nRBC: 0 % (ref 0.0–0.2)

## 2019-08-01 LAB — SARS CORONAVIRUS 2 BY RT PCR (HOSPITAL ORDER, PERFORMED IN ~~LOC~~ HOSPITAL LAB): SARS Coronavirus 2: NEGATIVE

## 2019-08-01 MED ORDER — LEVONORGESTREL 19.5 MCG/DAY IU IUD
INTRAUTERINE_SYSTEM | Freq: Once | INTRAUTERINE | Status: AC
  Administered 2019-08-02: 06:00:00 1 via INTRAUTERINE
  Filled 2019-08-01: qty 1

## 2019-08-01 MED ORDER — LACTATED RINGERS IV SOLN
INTRAVENOUS | Status: DC
  Administered 2019-08-01 – 2019-08-02 (×3): via INTRAVENOUS

## 2019-08-01 MED ORDER — ONDANSETRON HCL 4 MG/2ML IJ SOLN: 4.0000 mg | Freq: Four times a day (QID) | INTRAMUSCULAR | Status: DC | PRN

## 2019-08-01 MED ORDER — LACTATED RINGERS IV SOLN: 500.0000 mL | INTRAVENOUS | Status: DC | PRN

## 2019-08-01 MED ORDER — OXYTOCIN 40 UNITS IN NORMAL SALINE INFUSION - SIMPLE MED
2.5000 [IU]/h | INTRAVENOUS | Status: DC
  Filled 2019-08-01: qty 1000

## 2019-08-01 MED ORDER — TERBUTALINE SULFATE 1 MG/ML IJ SOLN: 0.2500 mg | Freq: Once | INTRAMUSCULAR | Status: DC | PRN

## 2019-08-01 MED ORDER — ACETAMINOPHEN 325 MG PO TABS: 650.0000 mg | ORAL_TABLET | ORAL | Status: DC | PRN

## 2019-08-01 MED ORDER — FENTANYL CITRATE (PF) 100 MCG/2ML IJ SOLN
50.0000 ug | INTRAMUSCULAR | Status: DC | PRN
  Administered 2019-08-01 – 2019-08-02 (×3): 100 ug via INTRAVENOUS
  Filled 2019-08-01 (×3): qty 2

## 2019-08-01 MED ORDER — SOD CITRATE-CITRIC ACID 500-334 MG/5ML PO SOLN: 30.0000 mL | ORAL | Status: DC | PRN

## 2019-08-01 MED ORDER — OXYCODONE-ACETAMINOPHEN 5-325 MG PO TABS: 2.0000 | ORAL_TABLET | ORAL | Status: DC | PRN

## 2019-08-01 MED ORDER — MISOPROSTOL 25 MCG QUARTER TABLET: 25.0000 ug | ORAL_TABLET | ORAL | Status: DC | PRN

## 2019-08-01 MED ORDER — LIDOCAINE HCL (PF) 1 % IJ SOLN: 30.0000 mL | INTRAMUSCULAR | Status: DC | PRN

## 2019-08-01 MED ORDER — OXYCODONE-ACETAMINOPHEN 5-325 MG PO TABS: 1.0000 | ORAL_TABLET | ORAL | Status: DC | PRN

## 2019-08-01 MED ORDER — OXYTOCIN BOLUS FROM INFUSION
500.0000 mL | Freq: Once | INTRAVENOUS | Status: AC
  Administered 2019-08-02: 500 mL via INTRAVENOUS

## 2019-08-01 NOTE — MAU Note (Signed)
Pt reports to MAU c/o really bad contractions that are every 10 min. Pt reports some spotting but denies her water being broke. +FM. Pt reports the pain is a 7/10. Pt is planning a epidural.

## 2019-08-01 NOTE — H&P (Signed)
OBSTETRIC ADMISSION HISTORY AND PHYSICAL  Alexandra Blackburn is a 23 y.o. female G1P0000 with IUP at [redacted]w[redacted]d by LMP and 23wk [place] presenting for SOL. Reported to MAU with contractions increasing in strength and frequency and progressed from 1-2.5 cm within 1.5 hours. Ctx started around 0330 on 9/12. Some spotting. She reports +FMs, No LOF, no VB, no blurry vision, headaches or peripheral edema, and RUQ pain.  She plans on breast feeding. She requests PP IUD for birth control. She received her prenatal care at [facility].   Dating: By LMP and 23 wk [place] --->  Estimated Date of Delivery: 07/27/19  Sono:  03/31/2019  @[redacted]w[redacted]d , CWD, normal anatomy, cephalic presentation, anterior placental lie, 575g, 55% EFW   Prenatal History/Complications:  A neg (s/p Rhogam 6/17) Late [facility]  Past Medical History: Past Medical History:  Diagnosis Date  . Medical history non-contributory     Past Surgical History: Past Surgical History:  Procedure Laterality Date  . NO PAST SURGERIES    . WISDOM TOOTH EXTRACTION      Obstetrical History: OB History    Gravida  1   Para  0   Term  0   Preterm  0   AB  0   Living  0     SAB  0   TAB  0   Ectopic  0   Multiple  0   Live Births  0           Social History: Social History   Socioeconomic History  . Marital status: Single    Spouse name: Not on file  . Number of children: Not on file  . Years of education: Not on file  . Highest education level: Not on file  Occupational History  . Not on file  Social Needs  . Financial resource strain: Not hard at all  . Food insecurity    Worry: Never true    Inability: Never true  . Transportation needs    Medical: No    Non-medical: No  Tobacco Use  . Smoking status: Never Smoker  . Smokeless tobacco: Never Used  Substance and Sexual Activity  . Alcohol use: Not Currently    Comment: social   . Drug use: No  . Sexual activity: Yes    Birth control/protection: None    Comment: 1st  intercourse- 17, partners- 2   Lifestyle  . Physical activity    Days per week: Not on file    Minutes per session: Not on file  . Stress: Not on file  Relationships  . Social Musician on phone: Not on file    Gets together: Not on file    Attends religious service: Not on file    Active member of club or organization: Not on file    Attends meetings of clubs or organizations: Not on file    Relationship status: Not on file  Other Topics Concern  . Not on file  Social History Narrative  . Not on file    Family History: Family History  Problem Relation Age of Onset  . Hyperlipidemia Mother   . Heart disease Maternal Grandmother   . Kidney disease Maternal Grandmother   . Diabetes Maternal Grandmother   . Hypertension Maternal Grandmother   . Asthma Maternal Grandmother   . Thyroid disease Maternal Grandmother   . Heart disease Paternal Grandmother   . Kidney disease Paternal Grandmother   . Diabetes Paternal Grandmother   . Hypertension  Paternal Grandmother   . Asthma Paternal Grandmother     Allergies: No Known Allergies  Medications Prior to Admission  Medication Sig Dispense Refill Last Dose  . aspirin EC 81 MG tablet Take 1 tablet (81 mg total) by mouth daily. Take after 12 weeks for prevention of preeclampsia later in pregnancy 300 tablet 2 Past Week at Unknown time  . EVENING PRIMROSE OIL PO Take by mouth at bedtime.    07/31/2019 at Unknown time  . Prenatal Vit-Fe Fumarate-FA (MULTIVITAMIN-PRENATAL) 27-0.8 MG TABS tablet Take 1 tablet by mouth daily at 12 noon.   07/31/2019 at Unknown time  . norethindrone-ethinyl estradiol (JUNEL FE,GILDESS FE,LOESTRIN FE) 1-20 MG-MCG tablet Take 1 tablet by mouth daily. (Patient not taking: Reported on 03/25/2019) 3 Package 4 Not Taking at Unknown time  . SPRINTEC 28 0.25-35 MG-MCG tablet TAKE 1 TABLET BY MOUTH EVERY DAY (Patient not taking: Reported on 03/25/2019) 84 tablet 4 Not Taking at Unknown time     Review of  Systems   All systems reviewed and negative except as stated in HPI  Blood pressure 110/89, pulse (!) 109, temperature 98.6 F (37 C), temperature source Oral, resp. rate 18, height 5\' 3"  (1.6 m), weight 88 kg, last menstrual period 10/20/2018, SpO2 99 %. General appearance: alert, cooperative, appears stated age and no distress Lungs: normal effort Heart: regular rate  Abdomen: soft, non-tender; bowel sounds normal Pelvic: gravid uterus Extremities: Homans sign is negative, no sign of DVT Presentation: Vertex by Curlene Labrum and RN exam Fetal monitoringBaseline: 140 bpm, Variability: Good {> 6 bpm), Accelerations: Reactive and Decelerations: Absent Uterine activityFrequency: Every 2-3 minutes Dilation: 2.5 Effacement (%): 50 Station: -2 Exam by:: Mason Jim, RN   Prenatal labs: ABO, Rh: --/--/PENDING (09/12 2151) Antibody: PENDING (09/12 2151) Rubella: 1.79 (05/06 1504) RPR: Non Reactive (06/03 1034)  HBsAg: Negative (05/06 1504)  HIV: Non Reactive (06/03 1034)  GBS: --Henderson Cloud (08/13 0947)  2 hr Glucola WNL Genetic screening  Low risk Anatomy US WNL as above  Prenatal Transfer Tool  Maternal Diabetes: No Genetic Screening: Normal Maternal Ultrasounds/Referrals: Normal Fetal Ultrasounds or other Referrals:  None Maternal Substance Abuse:  No Significant Maternal Medications:  None Significant Maternal Lab Results: Rh negative  Results for orders placed or performed during the hospital encounter of 08/01/19 (from the past 24 hour(s))  CBC   Collection Time: 08/01/19  9:50 PM  Result Value Ref Range   WBC 8.9 4.0 - 10.5 K/uL   RBC 4.43 3.87 - 5.11 MIL/uL   Hemoglobin 12.9 12.0 - 15.0 g/dL   HCT 38.9 36.0 - 46.0 %   MCV 87.8 80.0 - 100.0 fL   MCH 29.1 26.0 - 34.0 pg   MCHC 33.2 30.0 - 36.0 g/dL   RDW 14.1 11.5 - 15.5 %   Platelets 277 150 - 400 K/uL   nRBC 0.0 0.0 - 0.2 %  Type and screen   Collection Time: 08/01/19  9:51 PM  Result Value Ref Range   ABO/RH(D)  PENDING    Antibody Screen PENDING    Sample Expiration      08/04/2019,2359 Performed at [facility], [address], [place], [place] [address]     Patient Active Problem List   Diagnosis Date Noted  . Late prenatal care affecting pregnancy, antepartum 04/07/2019  . Rh negative status during pregnancy 03/26/2019  . Maternal obesity affecting pregnancy, antepartum 03/25/2019  . Supervision of normal pregnancy, antepartum 03/24/2019  . Dysmenorrhea 10/23/2013    Assessment/Plan:  Alexandra Blackburn is a 23 y.o. G1P0000 at 2918w5d here for SOL.  #Labor: Expectant management at this time; augment as needed.  #Pain: IV pain meds or Epidural when desires #FWB: Cat I; EFW: 3700 g #ID:  GBS neg #MOF: breast #MOC: PP IUD; consented and Liletta ordered #Circ:  Inpatient   Jerilynn Birkenheadhelsea Varick Keys, MD [facility] Fellow, [facility] for [facility], [facility] 08/01/2019, 10:57 PM

## 2019-08-02 ENCOUNTER — Inpatient Hospital Stay (HOSPITAL_COMMUNITY): Payer: 59 | Admitting: Anesthesiology

## 2019-08-02 ENCOUNTER — Encounter (HOSPITAL_COMMUNITY): Payer: Self-pay

## 2019-08-02 DIAGNOSIS — O480 Post-term pregnancy: Secondary | ICD-10-CM

## 2019-08-02 DIAGNOSIS — Z30430 Encounter for insertion of intrauterine contraceptive device: Secondary | ICD-10-CM

## 2019-08-02 DIAGNOSIS — Z3A40 40 weeks gestation of pregnancy: Secondary | ICD-10-CM

## 2019-08-02 LAB — RPR: RPR Ser Ql: NONREACTIVE

## 2019-08-02 MED ORDER — SIMETHICONE 80 MG PO CHEW: 80.0000 mg | CHEWABLE_TABLET | ORAL | Status: DC | PRN

## 2019-08-02 MED ORDER — PRENATAL MULTIVITAMIN CH
1.0000 | ORAL_TABLET | Freq: Every day | ORAL | Status: DC
  Administered 2019-08-03 – 2019-08-04 (×2): 1 via ORAL
  Filled 2019-08-02 (×2): qty 1

## 2019-08-02 MED ORDER — DIBUCAINE (PERIANAL) 1 % EX OINT: 1.0000 "application " | TOPICAL_OINTMENT | CUTANEOUS | Status: DC | PRN

## 2019-08-02 MED ORDER — SODIUM CHLORIDE (PF) 0.9 % IJ SOLN
INTRAMUSCULAR | Status: DC | PRN
  Administered 2019-08-02: 12 mL/h via EPIDURAL

## 2019-08-02 MED ORDER — ACETAMINOPHEN 325 MG PO TABS
650.0000 mg | ORAL_TABLET | Freq: Four times a day (QID) | ORAL | Status: DC | PRN
  Administered 2019-08-03: 650 mg via ORAL
  Filled 2019-08-02: qty 2

## 2019-08-02 MED ORDER — IBUPROFEN 600 MG PO TABS
600.0000 mg | ORAL_TABLET | Freq: Three times a day (TID) | ORAL | Status: DC | PRN
  Administered 2019-08-02 – 2019-08-04 (×7): 600 mg via ORAL
  Filled 2019-08-02 (×7): qty 1

## 2019-08-02 MED ORDER — EPHEDRINE 5 MG/ML INJ: 10.0000 mg | INTRAVENOUS | Status: DC | PRN

## 2019-08-02 MED ORDER — ONDANSETRON HCL 4 MG/2ML IJ SOLN: 4.0000 mg | INTRAMUSCULAR | Status: DC | PRN

## 2019-08-02 MED ORDER — FENTANYL-BUPIVACAINE-NACL 0.5-0.125-0.9 MG/250ML-% EP SOLN
12.0000 mL/h | EPIDURAL | Status: DC | PRN
  Filled 2019-08-02: qty 250

## 2019-08-02 MED ORDER — LIDOCAINE HCL (PF) 1 % IJ SOLN
INTRAMUSCULAR | Status: DC | PRN
  Administered 2019-08-02 (×2): 5 mL via EPIDURAL

## 2019-08-02 MED ORDER — LACTATED RINGERS IV SOLN
500.0000 mL | Freq: Once | INTRAVENOUS | Status: AC
  Administered 2019-08-02: 500 mL via INTRAVENOUS

## 2019-08-02 MED ORDER — PHENYLEPHRINE 40 MCG/ML (10ML) SYRINGE FOR IV PUSH (FOR BLOOD PRESSURE SUPPORT)
80.0000 ug | PREFILLED_SYRINGE | INTRAVENOUS | Status: DC | PRN
  Filled 2019-08-02: qty 10

## 2019-08-02 MED ORDER — SENNOSIDES-DOCUSATE SODIUM 8.6-50 MG PO TABS
2.0000 | ORAL_TABLET | ORAL | Status: DC
  Administered 2019-08-03 (×2): 2 via ORAL
  Filled 2019-08-02 (×2): qty 2

## 2019-08-02 MED ORDER — DIPHENHYDRAMINE HCL 50 MG/ML IJ SOLN: 12.5000 mg | INTRAMUSCULAR | Status: DC | PRN

## 2019-08-02 MED ORDER — WITCH HAZEL-GLYCERIN EX PADS: 1.0000 "application " | MEDICATED_PAD | CUTANEOUS | Status: DC | PRN

## 2019-08-02 MED ORDER — COCONUT OIL OIL
1.0000 "application " | TOPICAL_OIL | Status: DC | PRN
  Administered 2019-08-02: 1 via TOPICAL

## 2019-08-02 MED ORDER — TETANUS-DIPHTH-ACELL PERTUSSIS 5-2.5-18.5 LF-MCG/0.5 IM SUSP: 0.5000 mL | Freq: Once | INTRAMUSCULAR | Status: DC

## 2019-08-02 MED ORDER — BENZOCAINE-MENTHOL 20-0.5 % EX AERO
1.0000 "application " | INHALATION_SPRAY | CUTANEOUS | Status: DC | PRN
  Administered 2019-08-02: 1 via TOPICAL
  Filled 2019-08-02: qty 56

## 2019-08-02 MED ORDER — PHENYLEPHRINE 40 MCG/ML (10ML) SYRINGE FOR IV PUSH (FOR BLOOD PRESSURE SUPPORT)
80.0000 ug | PREFILLED_SYRINGE | INTRAVENOUS | Status: AC | PRN
  Administered 2019-08-02: 02:00:00 400 ug via INTRAVENOUS
  Administered 2019-08-02: 02:00:00 80 ug via INTRAVENOUS
  Administered 2019-08-02: 40 ug via INTRAVENOUS

## 2019-08-02 MED ORDER — ONDANSETRON HCL 4 MG PO TABS: 4.0000 mg | ORAL_TABLET | ORAL | Status: DC | PRN

## 2019-08-02 MED ORDER — DIPHENHYDRAMINE HCL 25 MG PO CAPS: 25.0000 mg | ORAL_CAPSULE | Freq: Four times a day (QID) | ORAL | Status: DC | PRN

## 2019-08-02 MED ORDER — MEASLES, MUMPS & RUBELLA VAC IJ SOLR: 0.5000 mL | Freq: Once | INTRAMUSCULAR | Status: DC

## 2019-08-02 NOTE — Progress Notes (Signed)
Labor Progress Note Alexandra Blackburn is a 23 y.o. G1P0000 at [redacted]w[redacted]d presented for SOL. S: Just received Epidural. Feeling slightly dizzy.   O:  BP 109/67   Pulse (!) 113   Temp 98.8 F (37.1 C) (Oral)   Resp 16   Ht 5\' 3"  (1.6 m)   Wt 88 kg   LMP 10/20/2018   SpO2 99%   BMI 34.37 kg/m  EFM: 135, moderate variability, reactive, pos accels, no decels  Ctx: q2-90m  CVE: Dilation: 5 Effacement (%): 50 Station: -1 Presentation: Vertex Exam by:: Army Fossa RN   A&P: 23 y.o. G1P0000 [redacted]w[redacted]d here for SOL. #Labor: Progressing well without augmentation. S/p SROM. Expectant management. Anticipate SVD. #Pain: Epidural #FWB: Cat I #GBS negative  Chauncey Mann, MD 2:21 AM

## 2019-08-02 NOTE — Lactation Note (Signed)
This note was copied from a baby's chart. Lactation Consultation Note  Patient Name: Alexandra Blackburn [id number] Date: 08/02/2019 Reason for consult: Follow-up assessment  P1 mother whose infant is now 40 hours old.    I was asked to observe a latch, however, when I arrived baby was doing STS with mother.  She stated that she had just finished feeding him and latching went well.  She had no questions/concerns at this time.  Reassured her that a LC would be available during the night as needed.     Maternal Data    Feeding Feeding Type: Breast Fed  LATCH Score Latch: Repeated attempts needed to sustain latch, nipple held in mouth throughout feeding, stimulation needed to elicit sucking reflex.  Audible Swallowing: A few with stimulation  Type of Nipple: Everted at rest and after stimulation  Comfort (Breast/Nipple): Soft / non-tender  Hold (Positioning): Assistance needed to correctly position infant at breast and maintain latch.  LATCH Score: 7  Interventions    Lactation Tools Discussed/Used     Consult Status Consult Status: Follow-up Date: 08/03/19 Follow-up type: In-patient    Little Ishikawa 08/02/2019, 5:36 PM

## 2019-08-02 NOTE — Anesthesia Procedure Notes (Signed)
Epidural Patient location during procedure: OB Start time: 08/02/2019 1:51 AM End time: 08/02/2019 2:06 AM  Staffing Anesthesiologist: Duane Boston, MD Performed: anesthesiologist   Preanesthetic Checklist Completed: patient identified, site marked, pre-op evaluation, timeout performed, IV checked, risks and benefits discussed and monitors and equipment checked  Epidural Patient position: sitting Prep: DuraPrep Patient monitoring: heart rate, cardiac monitor, continuous pulse ox and blood pressure Approach: midline Location: L2-L3 Injection technique: LOR saline  Needle:  Needle type: Tuohy  Needle gauge: 17 G Needle length: 9 cm Needle insertion depth: 7 cm Catheter size: 20 Guage Catheter at skin depth: 11 cm Test dose: negative and Other  Assessment Events: blood not aspirated, injection not painful, no injection resistance and negative IV test  Additional Notes Informed consent obtained prior to proceeding including risk of failure, 1% risk of PDPH, risk of minor discomfort and bruising.  Discussed rare but serious complications including epidural abscess, permanent nerve injury, epidural hematoma.  Discussed alternatives to epidural analgesia and patient desires to proceed.  Timeout performed pre-procedure verifying patient name, procedure, and platelet count.  Patient tolerated procedure well.

## 2019-08-02 NOTE — Lactation Note (Addendum)
This note was copied from a baby's chart. Lactation Consultation Note  Patient Name: Alexandra Blackburn [id number] Date: 08/02/2019 Reason for consult: Initial assessment;1st time breastfeeding   P1, Baby 22 hours old and mother sleepy but willing to review hand expression. Mother states baby has been latching well. Reviewed basics briefly and provided mother with lactation brochure. Feed on demand with cues.  Goal 8-12+ times per day after first 24 hrs.  Place baby STS if not cueing.  Suggest mother call for help as needed.     Maternal Data Has patient been taught Hand Expression?: Yes Does the patient have breastfeeding experience prior to this delivery?: No  Feeding Feeding Type: Breast Fed  LATCH Score Latch: Grasps breast easily, tongue down, lips flanged, rhythmical sucking.  Audible Swallowing: None  Type of Nipple: Everted at rest and after stimulation  Comfort (Breast/Nipple): Soft / non-tender  Hold (Positioning): Assistance needed to correctly position infant at breast and maintain latch.  LATCH Score: 7  Interventions Interventions: Breast feeding basics reviewed;Hand express  Lactation Tools Discussed/Used     Consult Status Consult Status: Follow-up Date: 08/03/19 Follow-up type: In-patient    [name] [facility] 08/02/2019, 11:42 AM

## 2019-08-02 NOTE — Discharge Summary (Signed)
Postpartum Discharge Summary     Patient Name: Alexandra Blackburn DOB: Mar 07, 1996 MRN: [id number]  Date of admission: 08/01/2019 Delivering Provider: Chauncey Mann   Date of discharge: 08/04/2019  Admitting diagnosis: CTX 5 mins apart Intrauterine pregnancy: [redacted]w[redacted]d    Secondary diagnosis:  Active Problems:   [redacted] weeks gestation of pregnancy  Additional problems: None     Discharge diagnosis: Term Pregnancy Delivered                                                                                                Post partum procedures:rhogam  Not indicated by baby's blood type  Augmentation: None  Complications: None  Hospital course:  Onset of Labor With Vaginal Delivery     23y.o. yo G1P0000 at 465w6das admitted in Latent Labor on 08/01/2019. Patient had an uncomplicated labor course as follows. Patient presented to L&D for SOL. Initial SVE: 1/thick/-3. Patient had SROM and received Epidural. She then progressed to complete. Post-placental IUD placed.   Membrane Rupture Time/Date: 12:46 AM ,08/02/2019   Intrapartum Procedures: Episiotomy: None [1]                                         Lacerations:  1st degree [2]  Patient had a delivery of a Viable infant. 08/02/2019  Information for the patient's newborn:  McEvelean, Bigleroy ReAvalyn0[[id number]]     Pateint had an uncomplicated postpartum course.  She is ambulating, tolerating a regular diet, passing flatus, and urinating well. Patient is discharged home in stable condition on 08/04/19.  Delivery time: 5:23 AM    Magnesium Sulfate received: No BMZ received: No Rhophylac:No MMR:No Transfusion:No  Physical exam  Vitals:   08/02/19 2045 08/03/19 0500 08/03/19 2132 08/04/19 0500  BP: 99/64 108/78 108/72 117/82  Pulse: 88 80 96 90  Resp:   18 20  Temp: 98.2 F (36.8 C) 98.1 F (36.7 C) 98.4 F (36.9 C) 98.9 F (37.2 C)  TempSrc: Oral Oral  Oral  SpO2: 98% 98%  98%  Weight:      Height:       General: alert,  cooperative and no distress Lochia: appropriate Uterine Fundus: firm DVT Evaluation: No evidence of DVT seen on physical exam. Negative Homan's sign. Labs: Lab Results  Component Value Date   WBC 8.9 08/01/2019   HGB 12.9 08/01/2019   HCT 38.9 08/01/2019   MCV 87.8 08/01/2019   PLT 277 08/01/2019   No flowsheet data found.  Discharge instruction: per After Visit Summary and "Baby and Me Booklet".  After visit meds:  Allergies as of 08/04/2019   No Known Allergies     Medication List    STOP taking these medications   EVENING PRIMROSE OIL PO   norethindrone-ethinyl estradiol 1-20 MG-MCG tablet Commonly known as: LOESTRIN FE   Sprintec 28 0.25-35 MG-MCG tablet Generic drug: norgestimate-ethinyl estradiol     TAKE these medications   aspirin EC 81 MG tablet Take 1 tablet (  81 mg total) by mouth daily. Take after 12 weeks for prevention of preeclampsia later in pregnancy   multivitamin-prenatal 27-0.8 MG Tabs tablet Take 1 tablet by mouth daily at 12 noon.       Diet: routine diet  Activity: Advance as tolerated. Pelvic rest for 6 weeks.   Outpatient follow up:4 weeks Follow up Appt: Future Appointments  Date Time Provider [place]  09/01/2019  9:15 AM Leftwich-Kirby, Kathie Dike, CNM [facility] None   Follow up Visit:   Please schedule this patient for Postpartum visit in: 4 weeks with the following provider: Any provider Low risk pregnancy complicated by: None Delivery mode:  SVD Anticipated Birth Control:  post-placental IUD PP Procedures needed: None  Schedule Integrated [place] visit: no    Newborn Data: Live born female  Birth Weight: 7 lb 7.4 oz (3385 g) APGAR: 8, 9  Newborn Delivery   Birth date/time: 08/02/2019 05:23:00 Delivery type: Vaginal, Spontaneous      Baby Feeding: Breast Disposition:home with mother   08/04/2019 Chauncey Mann, MD

## 2019-08-02 NOTE — Anesthesia Postprocedure Evaluation (Signed)
Anesthesia Post Note  Patient: Alexandra Blackburn  Procedure(s) Performed: AN AD HOC LABOR EPIDURAL     Patient location during evaluation: Mother Baby Anesthesia Type: Epidural Level of consciousness: awake and alert Pain management: pain level controlled Vital Signs Assessment: post-procedure vital signs reviewed and stable Respiratory status: spontaneous breathing, nonlabored ventilation and respiratory function stable Cardiovascular status: stable Postop Assessment: no headache, no backache, epidural receding, no apparent nausea or vomiting, patient able to bend at knees, adequate PO intake and able to ambulate Anesthetic complications: no    Last Vitals:  Vitals:   08/02/19 1300 08/02/19 1649  BP: 118/80 98/66  Pulse: 97 79  Resp:    Temp: 37 C 36.7 C  SpO2: 100% 100%    Last Pain:  Vitals:   08/02/19 1649  TempSrc:   PainSc: 0-No pain   Pain Goal: Patients Stated Pain Goal: 0 (08/01/19 2014)              Epidural/Spinal Function Cutaneous sensation: Normal sensation (08/02/19 1649), Patient able to flex knees: Yes (08/02/19 1649), Patient able to lift hips off bed: Yes (08/02/19 1649), Back pain beyond tenderness at insertion site: No (08/02/19 1649), Progressively worsening motor and/or sensory loss: No (08/02/19 1649), Bowel and/or bladder incontinence post epidural: No (08/02/19 1649)  Denecia Brunette Hristova

## 2019-08-02 NOTE — Anesthesia Preprocedure Evaluation (Signed)
Anesthesia Evaluation  °Patient identified by MRN, date of birth, ID band °Patient awake ° ° ° °Reviewed: °Allergy & Precautions, NPO status , Patient's Chart, lab work & pertinent test results ° °Airway °Mallampati: II ° °TM Distance: >3 FB °Neck ROM: Full ° ° ° Dental °no notable dental hx. °(+) Dental Advisory Given °  °Pulmonary °neg pulmonary ROS,  °  °Pulmonary exam normal ° ° ° ° ° ° ° Cardiovascular °negative cardio ROS °Normal cardiovascular exam ° ° °  °Neuro/Psych °negative neurological ROS ° negative psych ROS  ° GI/Hepatic °negative GI ROS, Neg liver ROS,   °Endo/Other  °negative endocrine ROS ° Renal/GU °negative Renal ROS  °negative genitourinary °  °Musculoskeletal °negative musculoskeletal ROS °(+)  ° Abdominal °  °Peds °negative pediatric ROS °(+)  Hematology °negative hematology ROS °(+)   °Anesthesia Other Findings ° ° Reproductive/Obstetrics °(+) Pregnancy ° °  ° ° ° ° ° ° ° ° ° ° ° ° ° °  °  ° ° ° ° ° ° ° ° °Anesthesia Physical °Anesthesia Plan ° °ASA: II ° °Anesthesia Plan: Epidural  ° °Post-op Pain Management:   ° °Induction:  ° °PONV Risk Score and Plan:  ° °Airway Management Planned:  ° °Additional Equipment:  ° °Intra-op Plan:  ° °Post-operative Plan:  ° °Informed Consent: I have reviewed the patients History and Physical, chart, labs and discussed the procedure including the risks, benefits and alternatives for the proposed anesthesia with the patient or authorized representative who has indicated his/her understanding and acceptance.  ° ° ° ° ° °Plan Discussed with: Anesthesiologist ° °Anesthesia Plan Comments:   ° ° ° ° ° ° °Anesthesia Quick Evaluation ° °

## 2019-08-03 ENCOUNTER — Inpatient Hospital Stay (HOSPITAL_COMMUNITY): Payer: 59

## 2019-08-03 ENCOUNTER — Inpatient Hospital Stay (HOSPITAL_COMMUNITY): Admission: RE | Admit: 2019-08-03 | Payer: 59 | Source: Home / Self Care

## 2019-08-03 NOTE — Lactation Note (Signed)
This note was copied from a baby's chart. Lactation Consultation Note  Patient Name: Alexandra Blackburn OHFGB'M Date: 08/03/2019 Reason for consult: Follow-up assessment  2115 - 2118 - I followed up with Ms. Austin. She was sleeping upon entry, and I inadvertently woke her. She states that her son is breast feeing well, and she declined lactation assistance. I encouraged her to call lactation for support tonight, as needed. She verbalized understanding.    Interventions Interventions: Breast feeding basics reviewed  Consult Status Consult Status: Follow-up Date: 08/04/19 Follow-up type: In-patient    Lenore Manner 08/03/2019, 10:00 PM

## 2019-08-03 NOTE — Progress Notes (Signed)
Post Partum Day 1 Subjective: no complaints, up ad lib, voiding, tolerating PO and + flatus  Breastfeeding well. Pain well-controlled.  Objective: Blood pressure 99/64, pulse 88, temperature 98.2 F (36.8 C), temperature source Oral, resp. rate 18, height 5\' 3"  (1.6 m), weight 88 kg, last menstrual period 10/20/2018, SpO2 98 %, unknown if currently breastfeeding.  Physical Exam:  General: alert, cooperative, appears stated age and no distress Lochia: appropriate Uterine Fundus: firm DVT Evaluation: No evidence of DVT seen on physical exam. Negative Homan's sign.  Recent Labs    08/01/19 2150  HGB 12.9  HCT 38.9    Assessment/Plan: Plan for discharge tomorrow per patient request on PPD#2. Breastfeeding; lactation support as able. Post-placental IUD placed.  Parents to call about circumcision and would like this done inpatient should insurance cover this. Mom and baby both Rh neg; no Rhogam indicated.  Vitals stable.   LOS: 2 days   Chauncey Mann 08/03/2019, 1:33 AM

## 2019-08-04 NOTE — Lactation Note (Signed)
This note was copied from a baby's chart. Lactation Consultation Note  Patient Name: Alexandra Blackburn BJYNW'G Date: 08/04/2019 Reason for consult: Term;Primapara;1st time breastfeeding  P1 mother whose infant is now 55 hours old.    Baby was being held in the cradle position but not latching.  He was content.  It appears that no LC has been able to assess a latch.  For this reason, I asked mother if I may observe/assist and she was agreeable.  Mother's breasts are large, soft and non tender and nipples are everted and intact.  She states her breasts feel heavier today.  Suggested the cross cradle hold and mother willing to try.  Positioned properly and assisted baby to latch to the left breast in this hold.  He had a wide mouth, flanged lips and immediately began sucking.  Demonstrated breast compressions and audible swallows noted.  Asked mother to listen for swallows and to hold him in deep to breast tissue.  Showed her correct finger and hand placement and she began to notice how well he was feeding.  She heard him swallowing at the breast.  Discussed breast feeding basics while observing him feed for 15 minutes.  He was still actively feeding when I left the room.  Mother knows how to gently stimulate during feedings when he becomes too sleepy.  Reviewed how to know when he is finished feeding.  Mother receptive to all teaching.  Encouraged to continue feeding 8-12 times/24 hours or sooner if baby shows feeding cues.  She will continue hand expression before/after feedings.  Engorgement prevention/treatment discussed.  Provided a manual pump with instructions for use.  Mother has a DEBP for home use.  She has no support person present at this time.  She has our OP phone number for questions/concerns after discharge.     Maternal Data Formula Feeding for Exclusion: Yes Has patient been taught Hand Expression?: Yes Does the patient have breastfeeding experience prior to this delivery?:  No  Feeding Feeding Type: Breast Fed  LATCH Score Latch: Grasps breast easily, tongue down, lips flanged, rhythmical sucking.  Audible Swallowing: Spontaneous and intermittent  Type of Nipple: Everted at rest and after stimulation  Comfort (Breast/Nipple): Soft / non-tender  Hold (Positioning): Assistance needed to correctly position infant at breast and maintain latch.  LATCH Score: 9  Interventions Interventions: Breast feeding basics reviewed;Assisted with latch;Skin to skin;Breast massage;Hand express;Breast compression;Hand pump;Support pillows;Adjust position  Lactation Tools Discussed/Used Pump Review: Setup, frequency, and cleaning;Milk Storage Initiated by:: Alexandra Blackburn Date initiated:: 08/04/19   Consult Status Consult Status: Complete Date: 08/04/19 Follow-up type: Call as needed    Alexandra Blackburn 08/04/2019, 9:39 AM

## 2019-08-05 LAB — TYPE AND SCREEN
ABO/RH(D): A NEG
Antibody Screen: POSITIVE
Unit division: 0
Unit division: 0

## 2019-08-05 LAB — BPAM RBC
Blood Product Expiration Date: 202010022359
Blood Product Expiration Date: 202010102359
Unit Type and Rh: 600
Unit Type and Rh: 600

## 2019-08-07 ENCOUNTER — Telehealth: Payer: Self-pay

## 2019-08-07 NOTE — Telephone Encounter (Signed)
Called patient to discuss leave dates for FMLA.  Left message on vm for patient to return call to office.

## 2019-09-01 ENCOUNTER — Ambulatory Visit (INDEPENDENT_AMBULATORY_CARE_PROVIDER_SITE_OTHER): Payer: 59 | Admitting: Advanced Practice Midwife

## 2019-09-01 ENCOUNTER — Other Ambulatory Visit: Payer: Self-pay

## 2019-09-01 ENCOUNTER — Encounter: Payer: Self-pay | Admitting: Advanced Practice Midwife

## 2019-09-01 DIAGNOSIS — Z975 Presence of (intrauterine) contraceptive device: Secondary | ICD-10-CM

## 2019-09-01 DIAGNOSIS — Z30431 Encounter for routine checking of intrauterine contraceptive device: Secondary | ICD-10-CM

## 2019-09-01 NOTE — Progress Notes (Signed)
Subjective:     Alexandra Blackburn is a 23 y.o. female who presents for a postpartum visit. She is 4 weeks postpartum following a spontaneous vaginal delivery. I have fully reviewed the prenatal and intrapartum course. The delivery was at 40.6 gestational weeks. Outcome: spontaneous vaginal delivery. Anesthesia: epidural. Postpartum course has been Unremarkable. Baby's course has been Unremarkable. Baby is feeding by breast. Bleeding no bleeding. Bowel function is normal. Bladder function is normal. Patient is not sexually active. Contraception method is IUD. Postpartum depression screening: negative=0  The following portions of the patient's history were reviewed and updated as appropriate: allergies, current medications, past family history, past medical history, past social history, past surgical history and problem list.  Review of Systems Pertinent items noted in HPI and remainder of comprehensive ROS otherwise negative.   Objective:    BP 110/70   Pulse 80   Ht 5\' 3"  (1.6 m)   Wt 181 lb (82.1 kg)   LMP 10/20/2018   Breastfeeding Yes   BMI 32.06 kg/m   VS reviewed, nursing note reviewed,  Constitutional: well developed, well nourished, no distress HEENT: normocephalic CV: normal rate Pulm/chest wall: normal effort Breast Exam: deferred Abdomen: soft Neuro: alert and oriented x 3 Skin: warm, dry Psych: affect normal Pelvic exam: Cervix pink, visually closed, without lesion, IUD string visualized, approximately 6-7 cm out of cervical os, scant white creamy discharge, vaginal walls and external genitalia normal. Perineum with good approximation, no edema or erythema, tiny amount of visible suture not yet dissolved.       Assessment/Plan:   1. Postpartum examination following vaginal delivery --Doing well, baby doing well, good support at home. --Breast and bottle feeding   2. IUD check up --IUD placed after delivery, string is long today so cut to 4 cm from cervical os.   Perineum well healed, some suture remains and is dissolving.  Questions answered.  Pt may resume sexual activity as desired.     Social/emotional concerns:  none.  Contraception: IUD  Follow up in: 1 year or as needed.   Fatima Blank, CNM 10:38 AM

## 2019-12-04 ENCOUNTER — Encounter (HOSPITAL_COMMUNITY): Payer: Self-pay | Admitting: Obstetrics & Gynecology

## 2021-01-03 ENCOUNTER — Encounter (HOSPITAL_COMMUNITY): Payer: Self-pay

## 2021-01-03 ENCOUNTER — Emergency Department (HOSPITAL_COMMUNITY)
Admission: EM | Admit: 2021-01-03 | Discharge: 2021-01-04 | Disposition: A | Discharge status: Home / Self Care | Payer: Medicaid Other | Attending: Emergency Medicine | Admitting: Emergency Medicine

## 2021-01-03 DIAGNOSIS — R000 Tachycardia, unspecified: Secondary | ICD-10-CM | POA: Insufficient documentation

## 2021-01-03 DIAGNOSIS — R55 Syncope and collapse: Secondary | ICD-10-CM | POA: Insufficient documentation

## 2021-01-03 DIAGNOSIS — R197 Diarrhea, unspecified: Secondary | ICD-10-CM | POA: Insufficient documentation

## 2021-01-03 DIAGNOSIS — R42 Dizziness and giddiness: Secondary | ICD-10-CM | POA: Diagnosis not present

## 2021-01-03 DIAGNOSIS — Z5321 Procedure and treatment not carried out due to patient leaving prior to being seen by health care provider: Secondary | ICD-10-CM | POA: Diagnosis not present

## 2021-01-03 NOTE — ED Triage Notes (Signed)
Pt c/o diarrhea x1wk, new onset dizziness/near syncope, tachycardia. Denies precipitating factors, known sick contact.

## 2021-01-04 ENCOUNTER — Ambulatory Visit (HOSPITAL_COMMUNITY): Payer: Self-pay

## 2021-01-04 LAB — BASIC METABOLIC PANEL
Anion gap: 11 (ref 5–15)
BUN: 17 mg/dL (ref 6–20)
CO2: 24 mmol/L (ref 22–32)
Calcium: 10.3 mg/dL (ref 8.9–10.3)
Chloride: 103 mmol/L (ref 98–111)
Creatinine, Ser: 0.8 mg/dL (ref 0.44–1.00)
GFR, Estimated: >60 mL/min (ref >60)
Glucose, Bld: 117 mg/dL — ABNORMAL HIGH (ref 70–99)
Potassium: 3.7 mmol/L (ref 3.5–5.1)
Sodium: 138 mmol/L (ref 135–145)

## 2021-01-04 LAB — URINALYSIS, ROUTINE W REFLEX MICROSCOPIC
Bilirubin Urine: NEGATIVE
Glucose, UA: NEGATIVE mg/dL
Hgb urine dipstick: NEGATIVE
Ketones, ur: NEGATIVE mg/dL
Leukocytes,Ua: NEGATIVE
Nitrite: NEGATIVE
Protein, ur: NEGATIVE mg/dL
Specific Gravity, Urine: 1.009 (ref 1.005–1.030)
pH: 5 (ref 5.0–8.0)

## 2021-01-04 LAB — CBC
HCT: 43.2 % (ref 36.0–46.0)
Hemoglobin: 14.5 g/dL (ref 12.0–15.0)
MCH: 30.8 pg (ref 26.0–34.0)
MCHC: 33.6 g/dL (ref 30.0–36.0)
MCV: 91.7 fL (ref 80.0–100.0)
Platelets: 390 10*3/uL (ref 150–400)
RBC: 4.71 MIL/uL (ref 3.87–5.11)
RDW: 12.4 % (ref 11.5–15.5)
WBC: 9.2 10*3/uL (ref 4.0–10.5)
nRBC: 0 % (ref 0.0–0.2)

## 2021-01-04 LAB — I-STAT BETA HCG BLOOD, ED (MC, WL, AP ONLY): I-stat hCG, quantitative: <5 m[IU]/mL (ref <5)

## 2021-01-04 NOTE — ED Notes (Signed)
Pt called for room x3, no answer 

## 2021-01-05 ENCOUNTER — Ambulatory Visit (HOSPITAL_COMMUNITY)
Admission: RE | Admit: 2021-01-05 | Discharge: 2021-01-05 | Disposition: A | Discharge status: Home / Self Care | Payer: Medicaid Other | Source: Ambulatory Visit

## 2021-01-05 ENCOUNTER — Encounter (HOSPITAL_COMMUNITY): Payer: Self-pay

## 2021-01-05 ENCOUNTER — Other Ambulatory Visit: Payer: Self-pay

## 2021-01-05 VITALS — BP 97/61 | HR 98 | Temp 98.6°F | Resp 18

## 2021-01-05 DIAGNOSIS — R42 Dizziness and giddiness: Secondary | ICD-10-CM | POA: Diagnosis not present

## 2021-01-05 MED ORDER — SODIUM CHLORIDE 0.9 % IV BOLUS
1000.0000 mL | Freq: Once | INTRAVENOUS | Status: AC
  Administered 2021-01-05: 1000 mL via INTRAVENOUS

## 2021-01-05 NOTE — ED Triage Notes (Signed)
Pt reports for the last 2 days heart rate on Apple watch is 90-120 when walking. States feeling like fainting and weak when standing up. Denies chest pain, vision changes, nausea, vomiting.

## 2021-01-05 NOTE — ED Provider Notes (Signed)
[facility]    CSN: [id number] Arrival date & time: 01/05/21  1478      History   Chief Complaint Chief Complaint  Patient presents with  . Tachycardia    HPI Alexandra Blackburn is a 25 y.o. female.   25 year old female comes in for 2-day history of tachycardia, lightheadedness during positional changes.  Patient states apple watch noted to have heart rate ranging 90-120s when walking.  Denies associated chest pain, shortness of breath, nausea, vomiting.  Denies syncope.  Has had 1 week history of diarrhea, averaging about 3 episodes a day.  Denies melena, hematochezia.  Denies urinary changes.     Past Medical History:  Diagnosis Date  . Medical history non-contributory     Patient Active Problem List   Diagnosis Date Noted  . IUD (intrauterine device) in place 09/01/2019  . Dysmenorrhea 10/23/2013    Past Surgical History:  Procedure Laterality Date  . NO PAST SURGERIES    . WISDOM TOOTH EXTRACTION      OB History    Gravida  1   Para  1   Term  1   Preterm  0   AB  0   Living  1     SAB  0   IAB  0   Ectopic  0   Multiple  0   Live Births  1            Home Medications    Prior to Admission medications   Medication Sig Start Date End Date Taking? Authorizing Provider  levonorgestrel (MIRENA) 20 MCG/24HR IUD 1 each by Intrauterine route once.   Yes [provider]    Family History Family History  Problem Relation Age of Onset  . Hyperlipidemia Mother   . Heart disease Maternal Grandmother   . Kidney disease Maternal Grandmother   . Diabetes Maternal Grandmother   . Hypertension Maternal Grandmother   . Asthma Maternal Grandmother   . Thyroid disease Maternal Grandmother   . Heart disease Paternal Grandmother   . Kidney disease Paternal Grandmother   . Diabetes Paternal Grandmother   . Hypertension Paternal Grandmother   . Asthma Paternal Grandmother     Social History Social History   Tobacco Use   . Smoking status: Never Smoker  . Smokeless tobacco: Never Used  Vaping Use  . Vaping Use: Never used  Substance Use Topics  . Alcohol use: Not Currently    Comment: social   . Drug use: No     Allergies   Patient has no known allergies.   Review of Systems Review of Systems  Reason unable to perform ROS: See HPI as above.     Physical Exam Triage Vital Signs ED Triage Vitals  Enc Vitals Group     BP 01/05/21 1021 97/61     Pulse Rate 01/05/21 1021 98     Resp 01/05/21 1021 18     Temp 01/05/21 1021 98.6 F (37 C)     Temp Source 01/05/21 1021 Oral     SpO2 01/05/21 1021 99 %     Weight --      Height --      Head Circumference --      Peak Flow --      Pain Score 01/05/21 1020 0     Pain Loc --      Pain Edu? --      Excl. in GC? --    Orthostatic VS for the  past 24 hrs:  BP- Lying Pulse- Lying BP- Sitting Pulse- Sitting BP- Standing at 0 minutes Pulse- Standing at 0 minutes  01/05/21 1212 123/82 58 117/74 75 116/83 86  01/05/21 1025 118/79 84 (!) 134/96 90 117/80 111    Updated Vital Signs BP 97/61 (BP Location: Right Arm)   Pulse 98   Temp 98.6 F (37 C) (Oral)   Resp 18   SpO2 99%   Breastfeeding No   Physical Exam Constitutional:      General: She is not in acute distress.    Appearance: She is well-developed. She is not ill-appearing, toxic-appearing or diaphoretic.  HENT:     Head: Normocephalic and atraumatic.  Eyes:     Conjunctiva/sclera: Conjunctivae normal.     Pupils: Pupils are equal, round, and reactive to light.  Cardiovascular:     Rate and Rhythm: Normal rate and regular rhythm.  Pulmonary:     Effort: Pulmonary effort is normal. No respiratory distress.     Comments: LCTAB Abdominal:     General: Bowel sounds are normal.     Palpations: Abdomen is soft.     Tenderness: There is no abdominal tenderness. There is no right CVA tenderness, left CVA tenderness, guarding or rebound.  Musculoskeletal:     Cervical back: Normal  range of motion and neck supple.  Skin:    General: Skin is warm and dry.  Neurological:     Mental Status: She is alert and oriented to person, place, and time.  Psychiatric:        Behavior: Behavior normal.        Judgment: Judgment normal.      UC Treatments / Results  Labs (all labs ordered are listed, but only abnormal results are displayed) Labs Reviewed - No data to display  EKG   Radiology No results found.  Procedures Procedures (including critical care time)  Medications Ordered in UC Medications  sodium chloride 0.9 % bolus 1,000 mL (1,000 mLs Intravenous New Bag/Given 01/05/21 1103)    Initial Impression / Assessment and Plan / UC Course  I have reviewed the triage vital signs and the nursing notes.  Pertinent labs & imaging results that were available during my care of the patient were reviewed by me and considered in my medical decision making (see chart for details).    Positive orthostatic vital.  Given 1 week history of diarrhea, this may be the source of dehydration, though only averaging 3 episodes a day.  Will provide 1L IV fluids and recheck orthostatics.  Discussed if still orthostatic, will require ED evaluation and management.  Patient expresses understanding and would like to proceed.  Patient with significantly improved symptoms after IV fluids. Repeat orthostatic negative.  Will have patient oral hydrate at home.  Return precautions given.  Patient expresses understanding and agrees to plan.  Final Clinical Impressions(s) / UC Diagnoses   Final diagnoses:  Orthostatic lightheadedness    ED Prescriptions    None     PDMP not reviewed this encounter.   Belinda Fisher, PA-C 01/05/21 1338

## 2021-01-05 NOTE — Discharge Instructions (Signed)
Your heart rate and blood pressure has improved with fluids. Please continue to hydrate, urine should be clear to pale yellow in color. Please follow up with PCP for reevaluation. If worsening symptoms, passing out, go to the emergency department for further evaluation.

## 2021-01-07 ENCOUNTER — Encounter (HOSPITAL_COMMUNITY): Payer: Self-pay | Admitting: *Deleted

## 2021-01-07 ENCOUNTER — Other Ambulatory Visit: Payer: Self-pay

## 2021-01-07 ENCOUNTER — Emergency Department (HOSPITAL_COMMUNITY)
Admission: EM | Admit: 2021-01-07 | Discharge: 2021-01-08 | Disposition: A | Discharge status: Home / Self Care | Payer: Medicaid Other | Attending: Emergency Medicine | Admitting: Emergency Medicine

## 2021-01-07 DIAGNOSIS — I10 Essential (primary) hypertension: Secondary | ICD-10-CM | POA: Diagnosis not present

## 2021-01-07 DIAGNOSIS — R197 Diarrhea, unspecified: Secondary | ICD-10-CM | POA: Insufficient documentation

## 2021-01-07 LAB — COMPREHENSIVE METABOLIC PANEL
ALT: 10 U/L (ref 0–44)
AST: 16 U/L (ref 15–41)
Albumin: 4.6 g/dL (ref 3.5–5.0)
Alkaline Phosphatase: 71 U/L (ref 38–126)
Anion gap: 11 (ref 5–15)
BUN: 10 mg/dL (ref 6–20)
CO2: 23 mmol/L (ref 22–32)
Calcium: 10.4 mg/dL — ABNORMAL HIGH (ref 8.9–10.3)
Chloride: 105 mmol/L (ref 98–111)
Creatinine, Ser: 0.82 mg/dL (ref 0.44–1.00)
GFR, Estimated: >60 mL/min (ref >60)
Glucose, Bld: 99 mg/dL (ref 70–99)
Potassium: 4 mmol/L (ref 3.5–5.1)
Sodium: 139 mmol/L (ref 135–145)
Total Bilirubin: 1 mg/dL (ref 0.3–1.2)
Total Protein: 8.3 g/dL — ABNORMAL HIGH (ref 6.5–8.1)

## 2021-01-07 LAB — URINALYSIS, ROUTINE W REFLEX MICROSCOPIC
Bilirubin Urine: NEGATIVE
Glucose, UA: NEGATIVE mg/dL
Hgb urine dipstick: NEGATIVE
Ketones, ur: 5 mg/dL — AB
Leukocytes,Ua: NEGATIVE
Nitrite: NEGATIVE
Protein, ur: NEGATIVE mg/dL
Specific Gravity, Urine: 1.006 (ref 1.005–1.030)
pH: 5 (ref 5.0–8.0)

## 2021-01-07 LAB — CBC
HCT: 47.3 % — ABNORMAL HIGH (ref 36.0–46.0)
Hemoglobin: 15.3 g/dL — ABNORMAL HIGH (ref 12.0–15.0)
MCH: 29.8 pg (ref 26.0–34.0)
MCHC: 32.3 g/dL (ref 30.0–36.0)
MCV: 92.2 fL (ref 80.0–100.0)
Platelets: 422 10*3/uL — ABNORMAL HIGH (ref 150–400)
RBC: 5.13 MIL/uL — ABNORMAL HIGH (ref 3.87–5.11)
RDW: 11.9 % (ref 11.5–15.5)
WBC: 11.1 10*3/uL — ABNORMAL HIGH (ref 4.0–10.5)
nRBC: 0 % (ref 0.0–0.2)

## 2021-01-07 LAB — LIPASE, BLOOD: Lipase: 30 U/L (ref 11–51)

## 2021-01-07 NOTE — ED Triage Notes (Signed)
Pt states that she was "feeling faint" checked her BP today and it was 140's/100's. Does c/o headache, increased HR. Diarrhea x 1 week. Recently treated at [facility] for dehydration on the 17th.

## 2021-01-08 LAB — I-STAT BETA HCG BLOOD, ED (MC, WL, AP ONLY): I-stat hCG, quantitative: <5 m[IU]/mL (ref <5)

## 2021-01-08 MED ORDER — SODIUM CHLORIDE 0.9 % IV BOLUS
1000.0000 mL | Freq: Once | INTRAVENOUS | Status: AC
  Administered 2021-01-08: 1000 mL via INTRAVENOUS

## 2021-01-08 NOTE — ED Provider Notes (Signed)
[facility] EMERGENCY DEPARTMENT Provider Note   CSN: [id number] Arrival date & time: 01/07/21  1906     History Chief Complaint  Patient presents with  . Hypertension    Alexandra Blackburn is a 25 y.o. female.  25 year old female no significant medical history.   Feeling fait onset Tuesday, intermittent, not positional, went to UC on Thursday. BP checks at home running high with elevated Hrs. BP 140s/100s, HR greater than 100, only when feeling fait. UC said likely dehydrated, given fluids, felt better after fluids and then symptoms returned yesterday. Has been drinking lots of water at home. Reports diarrhea x 1 week, improved after fluids at UC, returned yesterday, described as watery, sometimes green, non bloody. No known sick contacts, no sick contacts, no recent COVID test. No fevers, no recent abx. Reports at least 3 loose stools daily.  Reports feeling cold, feet and legs feel cold, fatigue, headaches yesterday.          Past Medical History:  Diagnosis Date  . Medical history non-contributory     Patient Active Problem List   Diagnosis Date Noted  . IUD (intrauterine device) in place 09/01/2019  . Dysmenorrhea 10/23/2013    Past Surgical History:  Procedure Laterality Date  . NO PAST SURGERIES    . WISDOM TOOTH EXTRACTION       OB History    Gravida  1   Para  1   Term  1   Preterm  0   AB  0   Living  1     SAB  0   IAB  0   Ectopic  0   Multiple  0   Live Births  1           Family History  Problem Relation Age of Onset  . Hyperlipidemia Mother   . Heart disease Maternal Grandmother   . Kidney disease Maternal Grandmother   . Diabetes Maternal Grandmother   . Hypertension Maternal Grandmother   . Asthma Maternal Grandmother   . Thyroid disease Maternal Grandmother   . Heart disease Paternal Grandmother   . Kidney disease Paternal Grandmother   . Diabetes Paternal Grandmother   . Hypertension Paternal  Grandmother   . Asthma Paternal Grandmother     Social History   Tobacco Use  . Smoking status: Never Smoker  . Smokeless tobacco: Never Used  Vaping Use  . Vaping Use: Never used  Substance Use Topics  . Alcohol use: Not Currently    Comment: social   . Drug use: No    Home Medications Prior to Admission medications   Medication Sig Start Date End Date Taking? Authorizing Provider  levonorgestrel (MIRENA) 20 MCG/24HR IUD 1 each by Intrauterine route once.   Yes [provider]    Allergies    Patient has no known allergies.  Review of Systems   Review of Systems  Constitutional: Negative for chills and fever.  Respiratory: Negative for shortness of breath.   Cardiovascular: Negative for chest pain, palpitations and leg swelling.  Gastrointestinal: Positive for diarrhea. Negative for abdominal pain, blood in stool, constipation, nausea and vomiting.  Genitourinary: Negative for dysuria and frequency.  Musculoskeletal: Negative for arthralgias and myalgias.  Skin: Negative for rash and wound.  Allergic/Immunologic: Negative for immunocompromised state.  Neurological: Positive for weakness.  All other systems reviewed and are negative.   Physical Exam Updated Vital Signs BP 116/81 (BP Location: Right Arm)   Pulse Marland Kitchen)  101   Temp 98 F (36.7 C) (Oral)   Resp 18   SpO2 100%   Physical Exam Vitals and nursing note reviewed.  Constitutional:      General: She is not in acute distress.    Appearance: She is well-developed and well-nourished. She is not diaphoretic.  HENT:     Head: Normocephalic and atraumatic.     Mouth/Throat:     Mouth: Mucous membranes are moist.  Eyes:     Conjunctiva/sclera: Conjunctivae normal.     Pupils: Pupils are equal, round, and reactive to light.  Cardiovascular:     Rate and Rhythm: Normal rate and regular rhythm.     Pulses: Normal pulses.     Heart sounds: Normal heart sounds.  Pulmonary:     Effort: Pulmonary effort  is normal.     Breath sounds: Normal breath sounds.  Abdominal:     Palpations: Abdomen is soft.     Tenderness: There is no abdominal tenderness.  Musculoskeletal:     Cervical back: Neck supple.     Right lower leg: No edema.     Left lower leg: No edema.  Skin:    General: Skin is warm and dry.     Findings: No erythema or rash.  Neurological:     Mental Status: She is alert and oriented to person, place, and time.     Sensory: No sensory deficit.     Motor: No weakness.  Psychiatric:        Mood and Affect: Mood and affect normal.        Behavior: Behavior normal.     ED Results / Procedures / Treatments   Labs (all labs ordered are listed, but only abnormal results are displayed) Labs Reviewed  COMPREHENSIVE METABOLIC PANEL - Abnormal; Notable for the following components:      Result Value   Calcium 10.4 (*)    Total Protein 8.3 (*)    All other components within normal limits  CBC - Abnormal; Notable for the following components:   WBC 11.1 (*)    RBC 5.13 (*)    Hemoglobin 15.3 (*)    HCT 47.3 (*)    Platelets 422 (*)    All other components within normal limits  URINALYSIS, ROUTINE W REFLEX MICROSCOPIC - Abnormal; Notable for the following components:   Color, Urine STRAW (*)    Ketones, ur 5 (*)    All other components within normal limits  GASTROINTESTINAL PANEL BY PCR, STOOL (REPLACES STOOL CULTURE)  C DIFFICILE QUICK SCREEN W PCR REFLEX  LIPASE, BLOOD  I-STAT BETA HCG BLOOD, ED ([facility], [facility], [facility] ONLY)    EKG EKG Interpretation  Date/Time:  Saturday January 07 2021 20:12:57 EST Ventricular Rate:  117 PR Interval:  126 QRS Duration: 80 QT Interval:  302 QTC Calculation: 421 R Axis:   82 Text Interpretation: Sinus tachycardia Nonspecific T wave abnormality Abnormal ECG Confirmed by Lockie Mola, Adam ([id number]) on 01/08/2021 10:00:22 AM   Radiology No results found.  Procedures Procedures   Medications Ordered in ED Medications  sodium chloride 0.9 %  bolus 1,000 mL (0 mLs Intravenous Stopped 01/08/21 1101)    ED Course  I have reviewed the triage vital signs and the nursing notes.  Pertinent labs & imaging results that were available during my care of the patient were reviewed by me and considered in my medical decision making (see chart for details).  Clinical Course as of 01/08/21 1132  Sun Jan 08, 5249  6429 25 year old female presents with complaint of feeling generally weak with reports of elevated blood pressure readings and heart rate readings at home. States she has had diarrhea (loose, nonbloody). Seen in urgent care earlier this week, found to be orthostatic and given IV fluids. Patient has continued oral hydration at home with water, Gatorade, liquid IV. Is scheduled to see her doctor tomorrow however came to the ER last night due to elevated heart rate at home. On exam, patient is well-appearing, abdomen is soft and nontender. Normal vitals at rest in bed. Orthostatic VS obtained in triage last night, HR elevated with standing. Patient was given IV fluids, ambulated to the bathroom and reports feeling weak with walking. Orthostatic VS reassessed, HR increases about 25 points with standing after 1 L NS. Discussed with Dr. Jacqulyn Bath, ER attending. Labs otherwise without significant findings. EKG non specific.  Plan is for dc, continue to rest and hydrate, see PCP tomorrow.  Patient was unable to provide stool sample today, no specific risks for c. Diff, advised to try Imodium and recheck with PCP tomorrow.  [LM]    Clinical Course User Index [LM] Alden Hipp   MDM Rules/Calculators/A&P                          Final Clinical Impression(s) / ED Diagnoses Final diagnoses:  Diarrhea, unspecified type    Rx / DC Orders ED Discharge Orders    None       Jeannie Fend, PA-C 01/08/21 1132    Long, Arlyss Repress, MD 01/09/21 [phone]

## 2021-01-08 NOTE — Discharge Instructions (Addendum)
Continue to hydrate at home, recommend low sugar Gatorade. See your doctor tomorrow as scheduled. Can try Imodium for diarrhea as directed on packaging.  Return to the ER for worsening or concerning symptoms.

## 2021-01-09 ENCOUNTER — Ambulatory Visit: Payer: Medicaid Other | Admitting: Family

## 2021-01-18 ENCOUNTER — Ambulatory Visit: Payer: Medicaid Other | Admitting: Physician Assistant

## 2021-04-26 ENCOUNTER — Encounter (HOSPITAL_COMMUNITY): Payer: Self-pay

## 2021-04-26 ENCOUNTER — Emergency Department (HOSPITAL_COMMUNITY): Payer: Medicaid Other

## 2021-04-26 ENCOUNTER — Emergency Department (HOSPITAL_COMMUNITY)
Admission: EM | Admit: 2021-04-26 | Discharge: 2021-04-27 | Disposition: A | Discharge status: Home / Self Care | Payer: Medicaid Other | Attending: Emergency Medicine | Admitting: Emergency Medicine

## 2021-04-26 DIAGNOSIS — R002 Palpitations: Secondary | ICD-10-CM | POA: Diagnosis not present

## 2021-04-26 DIAGNOSIS — R197 Diarrhea, unspecified: Secondary | ICD-10-CM | POA: Insufficient documentation

## 2021-04-26 DIAGNOSIS — R0789 Other chest pain: Secondary | ICD-10-CM | POA: Diagnosis present

## 2021-04-26 LAB — CBC WITH DIFFERENTIAL/PLATELET
Abs Immature Granulocytes: 0.03 10*3/uL (ref 0.00–0.07)
Basophils Absolute: 0.1 10*3/uL (ref 0.0–0.1)
Basophils Relative: 1 %
Eosinophils Absolute: 0 10*3/uL (ref 0.0–0.5)
Eosinophils Relative: 0 %
HCT: 42.1 % (ref 36.0–46.0)
Hemoglobin: 14.1 g/dL (ref 12.0–15.0)
Immature Granulocytes: 0 %
Lymphocytes Relative: 24 %
Lymphs Abs: 2.6 10*3/uL (ref 0.7–4.0)
MCH: 30.4 pg (ref 26.0–34.0)
MCHC: 33.5 g/dL (ref 30.0–36.0)
MCV: 90.7 fL (ref 80.0–100.0)
Monocytes Absolute: 0.5 10*3/uL (ref 0.1–1.0)
Monocytes Relative: 5 %
Neutro Abs: 7.9 10*3/uL — ABNORMAL HIGH (ref 1.7–7.7)
Neutrophils Relative %: 70 %
Platelets: 337 10*3/uL (ref 150–400)
RBC: 4.64 MIL/uL (ref 3.87–5.11)
RDW: 12.2 % (ref 11.5–15.5)
WBC: 11.1 10*3/uL — ABNORMAL HIGH (ref 4.0–10.5)
nRBC: 0 % (ref 0.0–0.2)

## 2021-04-26 LAB — BASIC METABOLIC PANEL
Anion gap: 9 (ref 5–15)
BUN: 15 mg/dL (ref 6–20)
CO2: 23 mmol/L (ref 22–32)
Calcium: 9.8 mg/dL (ref 8.9–10.3)
Chloride: 103 mmol/L (ref 98–111)
Creatinine, Ser: 1.07 mg/dL — ABNORMAL HIGH (ref 0.44–1.00)
GFR, Estimated: >60 mL/min (ref >60)
Glucose, Bld: 109 mg/dL — ABNORMAL HIGH (ref 70–99)
Potassium: 4 mmol/L (ref 3.5–5.1)
Sodium: 135 mmol/L (ref 135–145)

## 2021-04-26 LAB — I-STAT BETA HCG BLOOD, ED (MC, WL, AP ONLY): I-stat hCG, quantitative: <5 m[IU]/mL (ref <5)

## 2021-04-26 NOTE — ED Triage Notes (Signed)
Pt reports that she has been having CP, SOB, generalized numbness that has been going on and off for a few month, today started at 7pm

## 2021-04-26 NOTE — ED Provider Notes (Signed)
Emergency Medicine Provider Triage Evaluation Note  Alexandra Blackburn , a 25 y.o. female  was evaluated in triage.  Pt complains of palpitations, tingling to hands/feet, and some sob/chest pain that started earlier today.  Review of Systems  Positive: Chest pain, sob, palpitations, tingling to hands/feet Negative: cough  Physical Exam  BP (!) 139/95   Pulse 98   Temp 98.9 F (37.2 C) (Oral)   Resp 18   SpO2 100%  Gen:   Awake, no distress   Resp:  Normal effort  MSK:   Moves extremities without difficulty  Other:    Medical Decision Making  Medically screening exam initiated at 9:28 PM.  Appropriate orders placed.  Alexandra Blackburn was informed that the remainder of the evaluation will be completed by another provider, this initial triage assessment does not replace that evaluation, and the importance of remaining in the ED until their evaluation is complete.     Rayne Du 04/26/21 2131    Pricilla Loveless, MD 04/26/21 [phone]

## 2021-04-27 MED ORDER — ALUM & MAG HYDROXIDE-SIMETH 200-200-20 MG/5ML PO SUSP
30.0000 mL | Freq: Once | ORAL | Status: AC
  Administered 2021-04-27: 30 mL via ORAL
  Filled 2021-04-27: qty 30

## 2021-04-27 NOTE — ED Provider Notes (Signed)
[facility]  CSN: [id number] Arrival date & time: 04/26/21 2100  Chief Complaint(s) Palpitations (/) and chest discomfort  HPI Alexandra Blackburn is a 25 y.o. female who presents to the emergency department with several months of intermittent frequent palpitations.  These began after a bout of prolonged diarrhea.  They are associated with mild chest discomfort and mild shortness of breath when heart rate is higher than normal.  He also feels tingling and coldness in her extremities.  This will balance from to side or involve all 4 extremities.  She denies any focal weakness.  No recent fevers or infections.  No coughing or congestion.  No abdominal pain.  Reports that the diarrhea has improved but she still having loose bowel movements.  No nausea or vomiting.  No other physical complaints.  Patient reports that she is seeing her PCP for these.  They obtain thyroid studies which were reassuring within the last month.  She has not seen a cardiologist yet.  She reports trying to stay hydrated and states that he tries to drink a lot of water.  She does not drink any caffeinated drinks.  The history is provided by the patient.   Past Medical History Past Medical History:  Diagnosis Date   Medical history non-contributory    Patient Active Problem List   Diagnosis Date Noted   IUD (intrauterine device) in place 09/01/2019   Dysmenorrhea 10/23/2013   Home Medication(s) Prior to Admission medications   Medication Sig Start Date End Date Taking? Authorizing Provider  levonorgestrel (MIRENA) 20 MCG/24HR IUD 1 each by Intrauterine route once.    [provider]                                                                                                                                    Past Surgical History Past Surgical History:  Procedure Laterality Date   NO PAST SURGERIES     WISDOM TOOTH EXTRACTION     Family  History Family History  Problem Relation Age of Onset   Hyperlipidemia Mother    Heart disease Maternal Grandmother    Kidney disease Maternal Grandmother    Diabetes Maternal Grandmother    Hypertension Maternal Grandmother    Asthma Maternal Grandmother    Thyroid disease Maternal Grandmother    Heart disease Paternal Grandmother    Kidney disease Paternal Grandmother    Diabetes Paternal Grandmother    Hypertension Paternal Grandmother    Asthma Paternal Grandmother     Social History Social History   Tobacco Use   Smoking status: Never   Smokeless tobacco: Never  Vaping Use   Vaping Use: Never used  Substance Use Topics   Alcohol use: Not Currently    Comment: social    Drug use: No   Allergies Patient has no known allergies.  Review of Systems Review of Systems All other systems are  reviewed and are negative for acute change except as noted in the HPI  Physical Exam Vital Signs  I have reviewed the triage vital signs BP 114/71   Pulse 94   Temp 98.4 F (36.9 C) (Oral)   Resp 18   SpO2 99%   Physical Exam Vitals reviewed.  Constitutional:      General: She is not in acute distress.    Appearance: She is well-developed. She is not diaphoretic.  HENT:     Head: Normocephalic and atraumatic.     Nose: Nose normal.  Eyes:     General: No scleral icterus.       Right eye: No discharge.        Left eye: No discharge.     Conjunctiva/sclera: Conjunctivae normal.     Pupils: Pupils are equal, round, and reactive to light.  Cardiovascular:     Rate and Rhythm: Normal rate. Rhythm regularly irregular.     Heart sounds: No murmur heard.   No friction rub. No gallop.  Pulmonary:     Effort: Pulmonary effort is normal. No respiratory distress.     Breath sounds: Normal breath sounds. No stridor. No rales.  Abdominal:     General: There is no distension.     Palpations: Abdomen is soft.     Tenderness: There is no abdominal tenderness.  Musculoskeletal:         General: No tenderness.     Cervical back: Normal range of motion and neck supple.  Skin:    General: Skin is warm and dry.     Findings: No erythema or rash.  Neurological:     Mental Status: She is alert and oriented to person, place, and time.    ED Results and Treatments Labs (all labs ordered are listed, but only abnormal results are displayed) Labs Reviewed  CBC WITH DIFFERENTIAL/PLATELET - Abnormal; Notable for the following components:      Result Value   WBC 11.1 (*)    Neutro Abs 7.9 (*)    All other components within normal limits  BASIC METABOLIC PANEL - Abnormal; Notable for the following components:   Glucose, Bld 109 (*)    Creatinine, Ser 1.07 (*)    All other components within normal limits  I-STAT BETA HCG BLOOD, ED ([facility], [facility], [facility] ONLY)                                                                                                                         EKG  EKG Interpretation  Date/Time:  Wednesday April 26 2021 21:20:44 EDT Ventricular Rate:  96 PR Interval:  134 QRS Duration: 72 QT Interval:  328 QTC Calculation: 414 R Axis:   93 Text Interpretation: Normal sinus rhythm Rightward axis Nonspecific T wave abnormality Abnormal ECG No significant change since last tracing Confirmed by Drema Pry [phone]) on 04/27/2021 2:41:12 AM        Radiology DG Chest 2 View  Result Date: 04/26/2021  CLINICAL DATA:  Palpitations. EXAM: CHEST - 2 VIEW COMPARISON:  None. FINDINGS: The heart size and mediastinal contours are within normal limits. Both lungs are clear. The visualized skeletal structures are unremarkable. IMPRESSION: No active cardiopulmonary disease. Electronically Signed   By: Aram Candela M.D.   On: 04/26/2021 21:56    Pertinent labs & imaging results that were available during my care of the patient were reviewed by me and considered in my medical decision making (see chart for details).  Medications Ordered in ED Medications  alum & mag  hydroxide-simeth (MAALOX/MYLANTA) 200-200-20 MG/5ML suspension 30 mL (30 mLs Oral Given 04/27/21 0932)                                                                                                                                    Procedures Procedures  (including critical care time)  Medical Decision Making / ED Course I have reviewed the nursing notes for this encounter and the patient's prior records (if available in EHR or on provided paperwork).   Alexandra Blackburn was evaluated in Emergency Department on 04/27/2021 for the symptoms described in the history of present illness. She was evaluated in the context of the global COVID-19 pandemic, which necessitated consideration that the patient might be at risk for infection with the SARS-CoV-2 virus that causes COVID-19. Institutional protocols and algorithms that pertain to the evaluation of patients at risk for COVID-19 are in a state of rapid change based on information released by regulatory bodies including the CDC and federal and state organizations. These policies and algorithms were followed during the patient's care in the ED.  Intermittent palpitations with chest discomfort. EKG without acute ischemic changes or evidence of pericarditis.  No dysrhythmias or blocks. On telemetry patient does have fluctuating heart rate between the 60s and 90s.  Still in sinus rhythm. Labs with stable leukocytosis.  No anemia.. No significant electrolyte derangements.  She does have mild renal insufficiency. Beta-hCG negative.  Presentation is highly atypical for ACS. Low suspicion for pulmonary embolism. Not classic pleuritic dissection or esophageal perforation.  Patient provided with GI cocktail which completely resolved her chest discomfort.       Final Clinical Impression(s) / ED Diagnoses Final diagnoses:  Palpitations   The patient appears reasonably screened and/or stabilized for discharge and I doubt any other medical condition or  other [facility] requiring further screening, evaluation, or treatment in the ED at this time prior to discharge. Safe for discharge with strict return precautions.  Disposition: Discharge  Condition: Good  I have discussed the results, Dx and Tx plan with the patient/family who expressed understanding and agree(s) with the plan. Discharge instructions discussed at length. The patient/family was given strict return precautions who verbalized understanding of the instructions. No further questions at time of discharge.    ED Discharge Orders     None        Follow Up: [facility] [place]  [facility] [address] [place] [address] [phone]  Call  if symptoms do not improve or  worsen     This chart was dictated using voice recognition software.  Despite best efforts to proofread,  errors can occur which can change the documentation meaning.    Nira Conn, MD 04/27/21 [phone]

## 2021-04-27 NOTE — Discharge Instructions (Addendum)
Dysautonomia or autonomic dysfunction is a condition in which the autonomic nervous system (ANS) does not work properly. This may affect the functioning of the heart, bladder, intestines, sweat glands, pupils, and blood vessels.  In the sympathetic nervous system (SNS), predominant dysautonomia is common along with fibromyalgia, chronic fatigue syndrome, irritable bowel syndrome, and interstitial cystitis, raising the possibility that such dysautonomia could be their common clustering underlying pathogenesis.[17]  In addition to sometimes being a symptom of dysautonomia, anxiety can sometimes physically manifest symptoms resembling autonomic dysfunction. A thorough investigation ruling out physiological causes is crucial, but in cases where relevant tests are performed and no causes are found or symptoms do not match any known disorders, a primary anxiety disorder is possible, but should not be presumed. For such patients, the anxiety sensitivity index may have better predictivity for anxiety disorders, while the Beck anxiety inventory may misleadingly suggest anxiety for patients with dysautonomia.  The autonomic nervous system is a component of the peripheral nervous system and comprises two branches: the sympathetic nervous system (SNS) and the parasympathetic nervous system (PSNS). The SNS controls the more active responses such as increasing heart rate and blood pressure. The PSNS slows down the heart rate and aids in digestion, for example. Symptoms typically arise from abnormal responses of either the sympathetic or parasympathetic systems based on situation or environment.

## 2021-05-10 ENCOUNTER — Ambulatory Visit (INDEPENDENT_AMBULATORY_CARE_PROVIDER_SITE_OTHER): Payer: Medicaid Other | Admitting: Obstetrics

## 2021-05-10 ENCOUNTER — Encounter: Payer: Self-pay | Admitting: Obstetrics

## 2021-05-10 ENCOUNTER — Other Ambulatory Visit: Payer: Self-pay

## 2021-05-10 ENCOUNTER — Other Ambulatory Visit (HOSPITAL_COMMUNITY)
Admission: RE | Admit: 2021-05-10 | Discharge: 2021-05-10 | Disposition: A | Discharge status: Home / Self Care | Payer: Medicaid Other | Source: Ambulatory Visit | Attending: Obstetrics | Admitting: Obstetrics

## 2021-05-10 VITALS — BP 117/74 | HR 69 | Ht 63.0 in | Wt 172.3 lb

## 2021-05-10 DIAGNOSIS — Z30431 Encounter for routine checking of intrauterine contraceptive device: Secondary | ICD-10-CM

## 2021-05-10 DIAGNOSIS — N898 Other specified noninflammatory disorders of vagina: Secondary | ICD-10-CM

## 2021-05-10 DIAGNOSIS — Z01419 Encounter for gynecological examination (general) (routine) without abnormal findings: Secondary | ICD-10-CM | POA: Diagnosis not present

## 2021-05-10 NOTE — Progress Notes (Signed)
Pt is in the office for annual Last pap 03-25-19 Currently has IUD, placed in 2020

## 2021-05-10 NOTE — Progress Notes (Signed)
Subjective:        Alexandra Blackburn is a 25 y.o. female here for a routine exam.  Current complaints: None.    Personal health questionnaire:  Is patient Ashkenazi Jewish, have a family history of breast and/or ovarian cancer: no Is there a family history of uterine cancer diagnosed at age < 51, gastrointestinal cancer, urinary tract cancer, family member who is a Personnel officer syndrome-associated carrier: no Is the patient overweight and hypertensive, family history of diabetes, personal history of gestational diabetes, preeclampsia or PCOS: no Is patient over 2, have PCOS,  family history of premature CHD under age 30, diabetes, smoke, have hypertension or peripheral artery disease:  no At any time, has a partner hit, kicked or otherwise hurt or frightened you?: no Over the past 2 weeks, have you felt down, depressed or hopeless?: no Over the past 2 weeks, have you felt little interest or pleasure in doing things?:no   Gynecologic History No LMP recorded. (Menstrual status: IUD). Contraception: IUD Last Pap: 2020. Results were: normal Last mammogram: n/a. Results were: n/a  Obstetric History OB History  Gravida Para Term Preterm AB Living  1 1 1  0 0 1  SAB IAB Ectopic Multiple Live Births  0 0 0 0 1    # Outcome Date GA Lbr Len/2nd Weight Sex Delivery Anes PTL Lv  1 Term 08/02/19 [redacted]w[redacted]d 01:38 / 00:53 7 lb 7.4 oz (3.385 kg) M Vag-Spont EPI  LIV    Past Medical History:  Diagnosis Date   Medical history non-contributory     Past Surgical History:  Procedure Laterality Date   NO PAST SURGERIES     WISDOM TOOTH EXTRACTION       Current Outpatient Medications:    levonorgestrel (MIRENA) 20 MCG/24HR IUD, 1 each by Intrauterine route once., Disp: , Rfl:  No Known Allergies  Social History   Tobacco Use   Smoking status: Never   Smokeless tobacco: Never  Substance Use Topics   Alcohol use: Not Currently    Comment: social     Family History  Problem Relation Age of  Onset   Hyperlipidemia Mother    Heart disease Maternal Grandmother    Kidney disease Maternal Grandmother    Diabetes Maternal Grandmother    Hypertension Maternal Grandmother    Asthma Maternal Grandmother    Thyroid disease Maternal Grandmother    Heart disease Paternal Grandmother    Kidney disease Paternal Grandmother    Diabetes Paternal Grandmother    Hypertension Paternal Grandmother    Asthma Paternal Grandmother       Review of Systems  Constitutional: negative for fatigue and weight loss Respiratory: negative for cough and wheezing Cardiovascular: negative for chest pain, fatigue and palpitations Gastrointestinal: negative for abdominal pain and change in bowel habits Musculoskeletal:negative for myalgias Neurological: negative for gait problems and tremors Behavioral/Psych: negative for abusive relationship, depression Endocrine: negative for temperature intolerance    Genitourinary:negative for abnormal menstrual periods, genital lesions, hot flashes, sexual problems and vaginal discharge Integument/breast: negative for breast lump, breast tenderness, nipple discharge and skin lesion(s)    Objective:       BP 117/74   Pulse 69   Ht 5\' 3"  (1.6 m)   Wt 172 lb 4.8 oz (78.2 kg)   BMI 30.52 kg/m  General:   Alert and no distress  Skin:   no rash or abnormalities  Lungs:   clear to auscultation bilaterally  Heart:   regular rate and rhythm, S1, S2  normal, no murmur, click, rub or gallop  Breasts:   normal without suspicious masses, skin or nipple changes or axillary nodes  Abdomen:  normal findings: no organomegaly, soft, non-tender and no hernia  Pelvis:  External genitalia: normal general appearance Urinary system: urethral meatus normal and bladder without fullness, nontender Vaginal: normal without tenderness, induration or masses Cervix: normal appearance.  IUD string visible Adnexa: normal bimanual exam Uterus: anteverted and non-tender, normal size    Lab Review Urine pregnancy test Labs reviewed yes Radiologic studies reviewed no  I have spent a total of 20 minutes of face-to-face time, excluding clinical staff time, reviewing notes and preparing to see patient, ordering tests and/or medications, and counseling the patient.   Assessment:    .1. Encounter for routine gynecological examination with Papanicolaou smear of cervix Rx: - Cytology - PAP( [place])  2. IUD check up - pleased with IUD  3. Vaginal discharge Rx: - Cervicovaginal ancillary only     Plan:    Education reviewed: calcium supplements, depression evaluation, low fat, low cholesterol diet, safe sex/STD prevention, self breast exams, and weight bearing exercise. Contraception: IUD. Follow up in: 1 year.     Brock Bad, MD 05/10/2021 4:27 PM

## 2021-05-15 LAB — CYTOLOGY - PAP: Diagnosis: NEGATIVE

## 2021-08-21 ENCOUNTER — Encounter: Payer: Self-pay | Admitting: Emergency Medicine

## 2021-08-21 ENCOUNTER — Other Ambulatory Visit: Payer: Self-pay

## 2021-08-21 ENCOUNTER — Ambulatory Visit
Admission: EM | Admit: 2021-08-21 | Discharge: 2021-08-21 | Disposition: A | Discharge status: Home / Self Care | Payer: Medicaid Other | Attending: Physician Assistant | Admitting: Physician Assistant

## 2021-08-21 DIAGNOSIS — J029 Acute pharyngitis, unspecified: Secondary | ICD-10-CM | POA: Diagnosis not present

## 2021-08-21 LAB — POCT RAPID STREP A (OFFICE): Rapid Strep A Screen: NEGATIVE

## 2021-08-21 NOTE — Discharge Instructions (Addendum)
Recommend Flonase, salt water gargles, ibuprofen as needed

## 2021-08-21 NOTE — ED Triage Notes (Signed)
Patient states that she had COVID about 2 weeks ago, recently within the past week, she feels her throat is swollen, difficulty swallowing.  Patient denies OTC meds.

## 2021-08-21 NOTE — ED Provider Notes (Signed)
[facility]    CSN: [id number] Arrival date & time: 08/21/21  1927      History   Chief Complaint Chief Complaint  Patient presents with   Sore Throat    HPI Alexandra Blackburn is a 25 y.o. female.      Pt reports she had COVID two weeks ago.  She reports over the last week she has been experiencing a sore throat, worse with swallowing.  She has tried otc cold and cough medications with minimal relief. Denies fever, chills, n/v/d   Past Medical History:  Diagnosis Date   Medical history non-contributory     Patient Active Problem List   Diagnosis Date Noted   IUD (intrauterine device) in place 09/01/2019   Dysmenorrhea 10/23/2013    Past Surgical History:  Procedure Laterality Date   NO PAST SURGERIES     WISDOM TOOTH EXTRACTION      OB History     Gravida  1   Para  1   Term  1   Preterm  0   AB  0   Living  1      SAB  0   IAB  0   Ectopic  0   Multiple  0   Live Births  1            Home Medications    Prior to Admission medications   Medication Sig Start Date End Date Taking? Authorizing Provider  levonorgestrel (MIRENA) 20 MCG/24HR IUD 1 each by Intrauterine route once.   Yes [provider]    Family History Family History  Problem Relation Age of Onset   Hyperlipidemia Mother    Heart disease Maternal Grandmother    Kidney disease Maternal Grandmother    Diabetes Maternal Grandmother    Hypertension Maternal Grandmother    Asthma Maternal Grandmother    Thyroid disease Maternal Grandmother    Heart disease Paternal Grandmother    Kidney disease Paternal Grandmother    Diabetes Paternal Grandmother    Hypertension Paternal Grandmother    Asthma Paternal Grandmother     Social History Social History   Tobacco Use   Smoking status: Never   Smokeless tobacco: Never  Vaping Use   Vaping Use: Never used  Substance Use Topics   Alcohol use: Not Currently    Comment: social    Drug use: No      Allergies   Patient has no known allergies.   Review of Systems Review of Systems  Constitutional:  Negative for chills and fever.  HENT:  Positive for sore throat. Negative for ear pain.   Eyes:  Negative for pain and visual disturbance.  Respiratory:  Negative for cough and shortness of breath.   Cardiovascular:  Negative for chest pain and palpitations.  Gastrointestinal:  Negative for abdominal pain and vomiting.  Genitourinary:  Negative for dysuria and hematuria.  Musculoskeletal:  Negative for arthralgias and back pain.  Skin:  Negative for color change and rash.  Neurological:  Negative for seizures and syncope.  All other systems reviewed and are negative.   Physical Exam Triage Vital Signs ED Triage Vitals  Enc Vitals Group     BP 08/21/21 1941 123/75     Pulse Rate 08/21/21 1941 80     Resp 08/21/21 1941 18     Temp 08/21/21 1941 98.5 F (36.9 C)     Temp Source 08/21/21 1941 Oral     SpO2 08/21/21 1941 98 %  Weight 08/21/21 1942 170 lb (77.1 kg)     Height 08/21/21 1942 5\' 3"  (1.6 m)     Head Circumference --      Peak Flow --      Pain Score 08/21/21 1942 7     Pain Loc --      Pain Edu? --      Excl. in GC? --    No data found.  Updated Vital Signs BP 123/75 (BP Location: Right Leg)   Pulse 80   Temp 98.5 F (36.9 C) (Oral)   Resp 18   Ht 5\' 3"  (1.6 m)   Wt 170 lb (77.1 kg)   SpO2 98%   BMI 30.11 kg/m   Visual Acuity Right Eye Distance:   Left Eye Distance:   Bilateral Distance:    Right Eye Near:   Left Eye Near:    Bilateral Near:     Physical Exam Vitals and nursing note reviewed.  Constitutional:      General: She is not in acute distress.    Appearance: She is well-developed.  HENT:     Head: Normocephalic and atraumatic.     Mouth/Throat:     Pharynx: Posterior oropharyngeal erythema present. No pharyngeal swelling or oropharyngeal exudate.  Eyes:     Conjunctiva/sclera: Conjunctivae normal.  Cardiovascular:      Rate and Rhythm: Normal rate and regular rhythm.     Heart sounds: No murmur heard. Pulmonary:     Effort: Pulmonary effort is normal. No respiratory distress.     Breath sounds: Normal breath sounds.  Abdominal:     Palpations: Abdomen is soft.     Tenderness: There is no abdominal tenderness.  Musculoskeletal:     Cervical back: Neck supple.  Skin:    General: Skin is warm and dry.  Neurological:     Mental Status: She is alert.     UC Treatments / Results  Labs (all labs ordered are listed, but only abnormal results are displayed) Labs Reviewed  POCT RAPID STREP A (OFFICE)    EKG   Radiology No results found.  Procedures Procedures (including critical care time)  Medications Ordered in UC Medications - No data to display  Initial Impression / Assessment and Plan / UC Course  I have reviewed the triage vital signs and the nursing notes.  Pertinent labs & imaging results that were available during my care of the patient were reviewed by me and considered in my medical decision making (see chart for details).  Pharyngitis.  Advise supportive treatment.  Return precautions discussed.  Final Clinical Impressions(s) / UC Diagnoses   Final diagnoses:  Acute pharyngitis, unspecified etiology     Discharge Instructions      Recommend Flonase, salt water gargles, ibuprofen as needed     ED Prescriptions   None    PDMP not reviewed this encounter.   Ward, 10/21/21, PA-C 09/06/21 1933

## 2021-10-29 IMAGING — CR DG CHEST 2V
2 series · 2 of 2 positions shown · non-contrast
Comparison: None.

CLINICAL DATA: Palpitations.

EXAM:
CHEST - 2 VIEW

[chest pa]
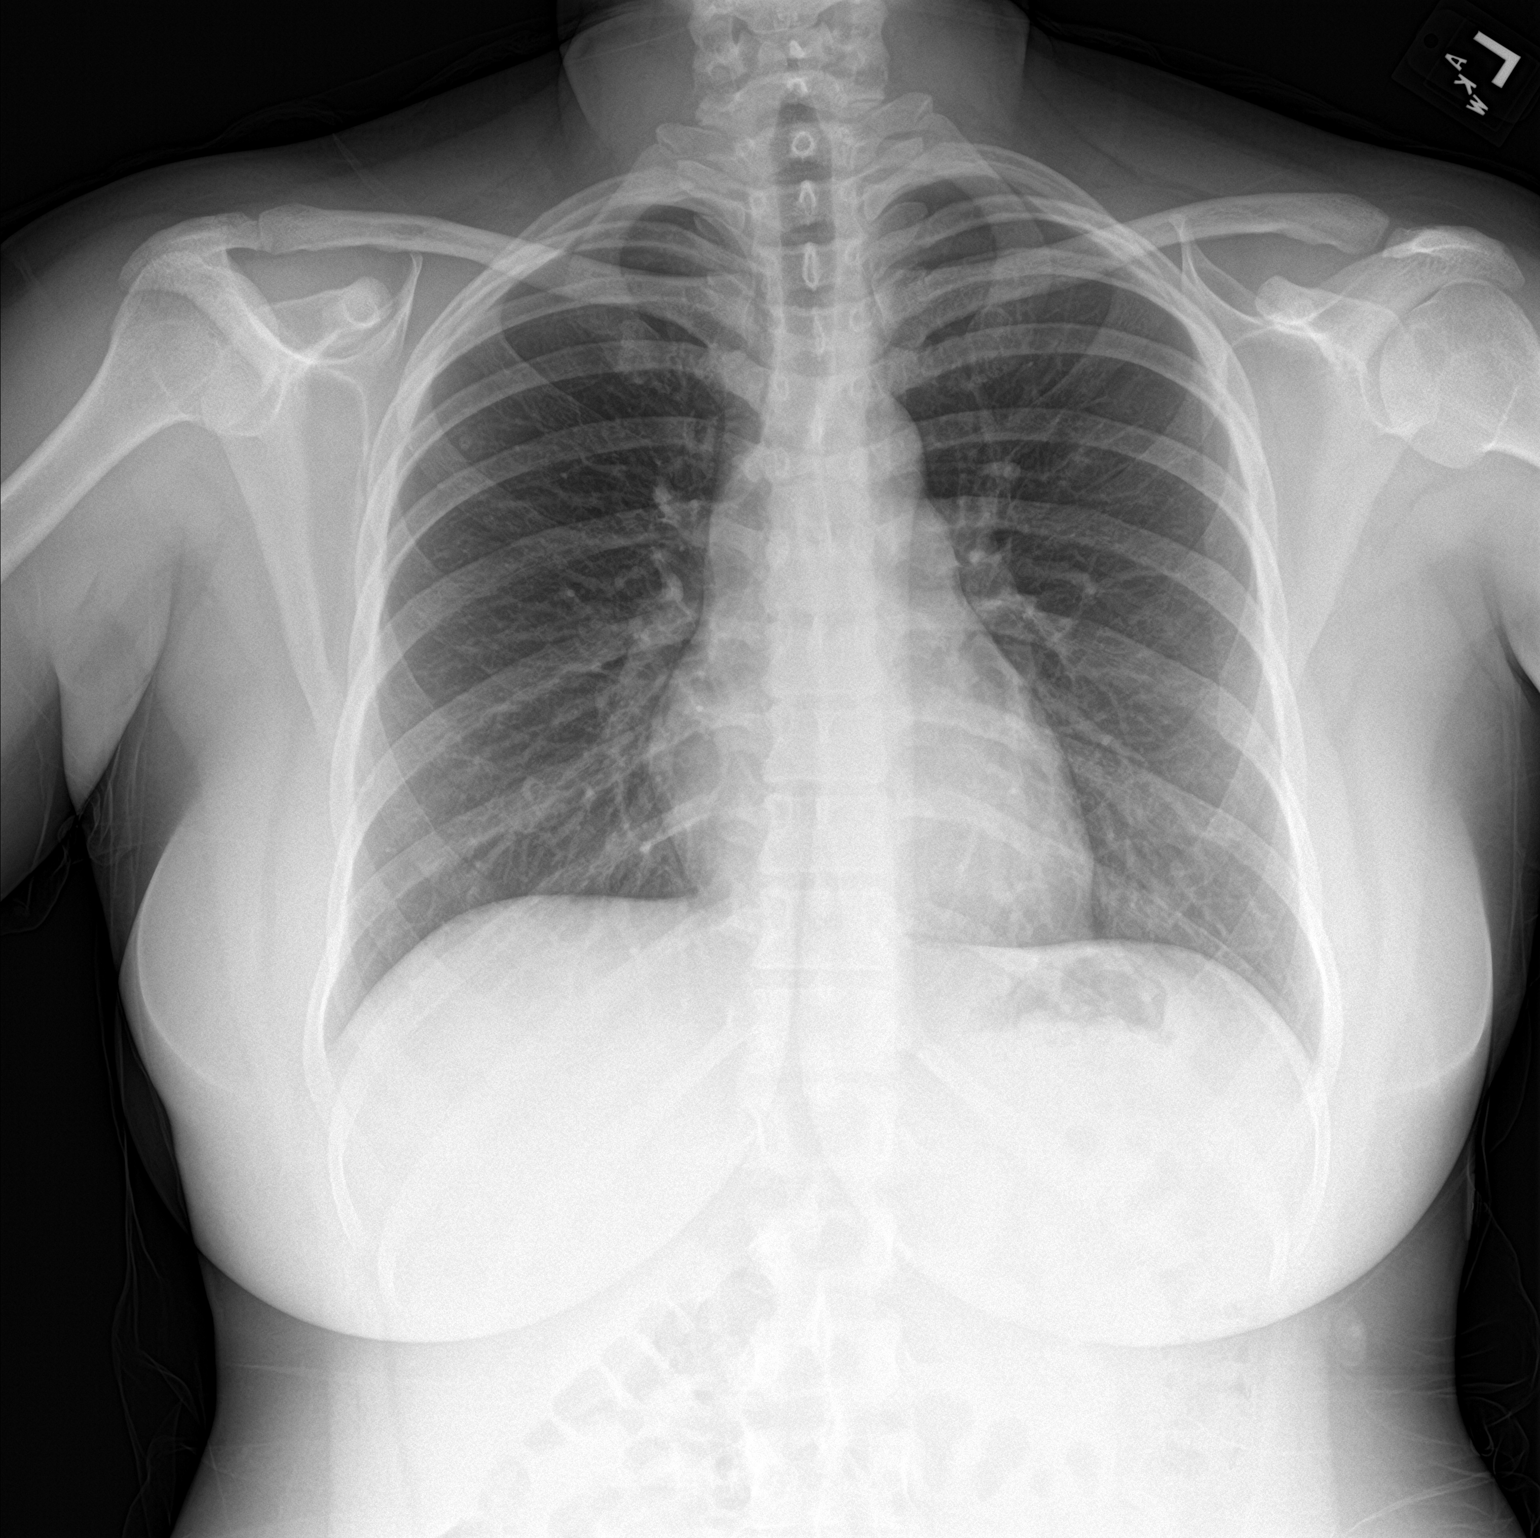

[chest lat]
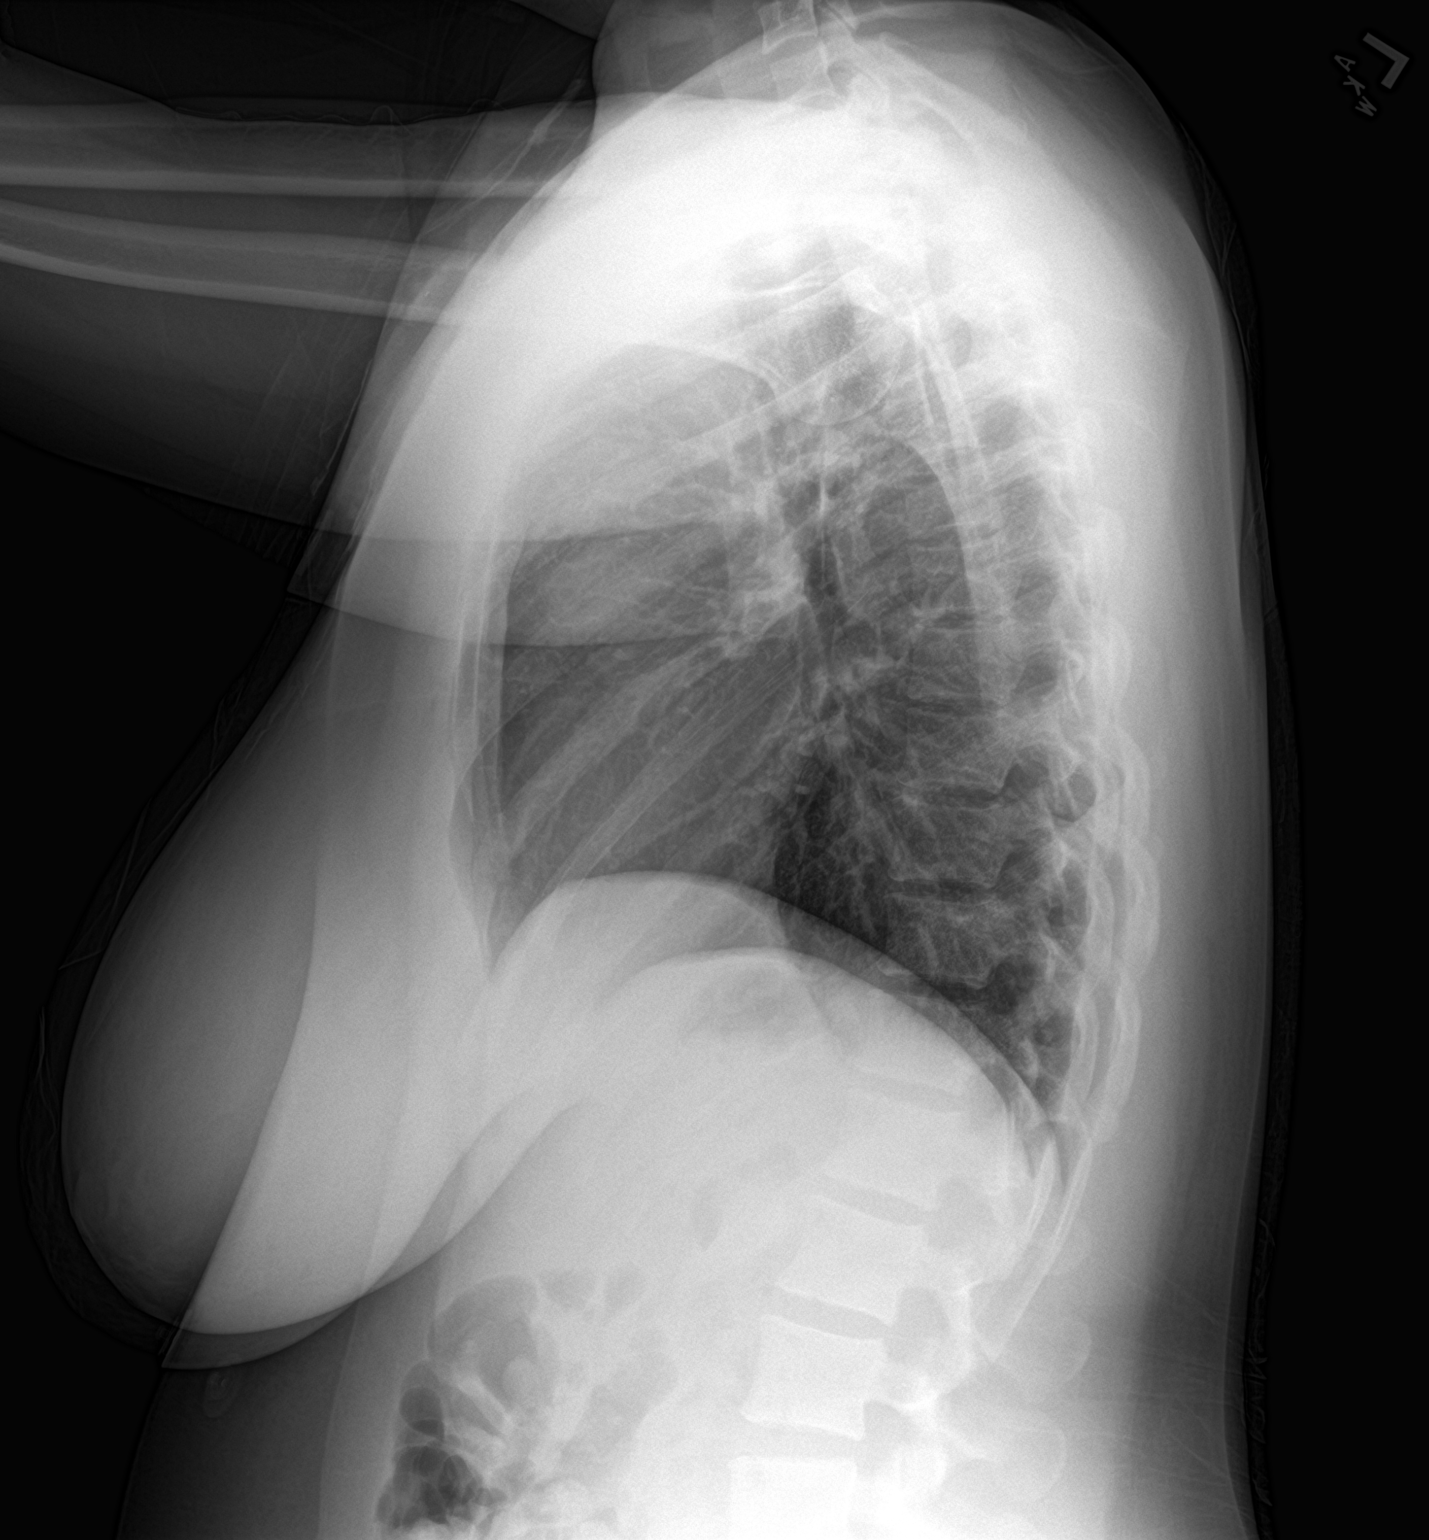

[2 of 2 positions shown; findings below may reference images not displayed]

FINDINGS: The heart size and mediastinal contours are within normal limits.
Both lungs are clear. The visualized skeletal structures are
unremarkable.
IMPRESSION: No active cardiopulmonary disease.

## 2022-07-06 ENCOUNTER — Ambulatory Visit
Admission: EM | Admit: 2022-07-06 | Discharge: 2022-07-06 | Disposition: A | Discharge status: Home / Self Care | Payer: Medicaid Other | Attending: Urgent Care | Admitting: Urgent Care

## 2022-07-06 DIAGNOSIS — R509 Fever, unspecified: Secondary | ICD-10-CM

## 2022-07-06 DIAGNOSIS — R070 Pain in throat: Secondary | ICD-10-CM

## 2022-07-06 DIAGNOSIS — J020 Streptococcal pharyngitis: Secondary | ICD-10-CM | POA: Diagnosis not present

## 2022-07-06 DIAGNOSIS — Z20818 Contact with and (suspected) exposure to other bacterial communicable diseases: Secondary | ICD-10-CM

## 2022-07-06 MED ORDER — AMOXICILLIN 500 MG PO CAPS: 500.0000 mg | ORAL_CAPSULE | Freq: Two times a day (BID) | ORAL | 0 refills | Status: DC

## 2022-07-06 MED ORDER — ACETAMINOPHEN 325 MG PO TABS
650.0000 mg | ORAL_TABLET | Freq: Once | ORAL | Status: AC
  Administered 2022-07-06: 650 mg via ORAL

## 2022-07-06 NOTE — ED Triage Notes (Signed)
Sore throat started 1 week ago, thought it was just allergies, but then son was sick this morning took him to doctor and he tested positive for strep.

## 2022-07-06 NOTE — ED Provider Notes (Addendum)
[facility]   MRN: [id number] DOB: 11-24-1995  Subjective:   Alexandra Blackburn is a 26 y.o. female presenting for 1 week history of persistent worsening throat pain.  She has been using over-the-counter medications.  Unfortunately, her son became sick as well and she took him to the doctor today.  He tested positive for strep throat.  No runny or stuffy nose, cough, chest pain or shortness of breath.  No rashes.  No current facility-administered medications for this encounter.  Current Outpatient Medications:    levonorgestrel (MIRENA) 20 MCG/24HR IUD, 1 each by Intrauterine route once., Disp: , Rfl:    No Known Allergies  Past Medical History:  Diagnosis Date   Medical history non-contributory      Past Surgical History:  Procedure Laterality Date   NO PAST SURGERIES     WISDOM TOOTH EXTRACTION      Family History  Problem Relation Age of Onset   Hyperlipidemia Mother    Heart disease Maternal Grandmother    Kidney disease Maternal Grandmother    Diabetes Maternal Grandmother    Hypertension Maternal Grandmother    Asthma Maternal Grandmother    Thyroid disease Maternal Grandmother    Heart disease Paternal Grandmother    Kidney disease Paternal Grandmother    Diabetes Paternal Grandmother    Hypertension Paternal Grandmother    Asthma Paternal Grandmother     Social History   Tobacco Use   Smoking status: Never   Smokeless tobacco: Never  Vaping Use   Vaping Use: Never used  Substance Use Topics   Alcohol use: Not Currently    Comment: social    Drug use: No    ROS   Objective:   Vitals: BP 113/80 (BP Location: Left Arm)   Pulse 83   Temp (!) 101.2 F (38.4 C) (Oral)   SpO2 97%   Physical Exam Constitutional:      General: She is not in acute distress.    Appearance: Normal appearance. She is well-developed. She is not ill-appearing, toxic-appearing or diaphoretic.  HENT:     Head: Normocephalic and atraumatic.      Nose: Nose normal.     Mouth/Throat:     Mouth: Mucous membranes are moist.     Pharynx: Pharyngeal swelling, oropharyngeal exudate and posterior oropharyngeal erythema present. No uvula swelling.     Tonsils: No tonsillar exudate or tonsillar abscesses. 0 on the right. 0 on the left.  Eyes:     General: No scleral icterus.       Right eye: No discharge.        Left eye: No discharge.     Extraocular Movements: Extraocular movements intact.  Cardiovascular:     Rate and Rhythm: Normal rate.  Pulmonary:     Effort: Pulmonary effort is normal.  Skin:    General: Skin is warm and dry.  Neurological:     General: No focal deficit present.     Mental Status: She is alert and oriented to person, place, and time.  Psychiatric:        Mood and Affect: Mood normal.        Behavior: Behavior normal.    Patient given Tylenol in clinic for fever.  Assessment and Plan :   PDMP not reviewed this encounter.  1. Strep pharyngitis   2. Strep throat exposure   3. Throat pain   4. Fever, unspecified    Will treat empirically for pharyngitis given physical exam  findings and strep exposure.  Patient is to start amoxicillin, use supportive care otherwise. Counseled patient on potential for adverse effects with medications prescribed/recommended today, ER and return-to-clinic precautions discussed, patient verbalized understanding.   Wallis Bamberg, [place] 07/06/22 1842

## 2022-07-06 NOTE — ED Notes (Signed)
Sore throat started 1 week ago, thought it was just allergies, but then son was sick this morning took him to doctor and he tested positive for strep.  Pain 4/10

## 2022-11-09 ENCOUNTER — Ambulatory Visit: Admit: 2022-11-09 | Discharge status: Against Medical Advice | Payer: Medicaid Other

## 2023-02-01 ENCOUNTER — Other Ambulatory Visit (HOSPITAL_COMMUNITY)
Admission: RE | Admit: 2023-02-01 | Discharge: 2023-02-01 | Disposition: A | Discharge status: Home / Self Care | Payer: 59 | Source: Ambulatory Visit | Attending: Obstetrics | Admitting: Obstetrics

## 2023-02-01 ENCOUNTER — Ambulatory Visit (INDEPENDENT_AMBULATORY_CARE_PROVIDER_SITE_OTHER): Payer: 59 | Admitting: Obstetrics

## 2023-02-01 ENCOUNTER — Encounter: Payer: Self-pay | Admitting: Obstetrics

## 2023-02-01 VITALS — BP 108/72 | HR 70 | Ht 63.0 in | Wt 174.0 lb

## 2023-02-01 DIAGNOSIS — Z30431 Encounter for routine checking of intrauterine contraceptive device: Secondary | ICD-10-CM

## 2023-02-01 DIAGNOSIS — Z01419 Encounter for gynecological examination (general) (routine) without abnormal findings: Secondary | ICD-10-CM | POA: Diagnosis not present

## 2023-02-01 DIAGNOSIS — Z113 Encounter for screening for infections with a predominantly sexual mode of transmission: Secondary | ICD-10-CM

## 2023-02-01 DIAGNOSIS — E669 Obesity, unspecified: Secondary | ICD-10-CM

## 2023-02-01 DIAGNOSIS — N898 Other specified noninflammatory disorders of vagina: Secondary | ICD-10-CM | POA: Diagnosis not present

## 2023-02-01 MED ORDER — METRONIDAZOLE 500 MG PO TABS: 500.0000 mg | ORAL_TABLET | Freq: Two times a day (BID) | ORAL | 2 refills | Status: DC

## 2023-02-01 NOTE — Progress Notes (Signed)
Subjective:        Alexandra Blackburn is a 27 y.o. female here for a routine exam.  Current complaints: Vaginal discharge.    Personal health questionnaire:  Is patient Ashkenazi Jewish, have a family history of breast and/or ovarian cancer: no Is there a family history of uterine cancer diagnosed at age < 32, gastrointestinal cancer, urinary tract cancer, family member who is a Field seismologist syndrome-associated carrier: no Is the patient overweight and hypertensive, family history of diabetes, personal history of gestational diabetes, preeclampsia or PCOS: no Is patient over 32, have PCOS,  family history of premature CHD under age 46, diabetes, smoke, have hypertension or peripheral artery disease:  no At any time, has a partner hit, kicked or otherwise hurt or frightened you?: no Over the past 2 weeks, have you felt down, depressed or hopeless?: no Over the past 2 weeks, have you felt little interest or pleasure in doing things?:no   Gynecologic History No LMP recorded. (Menstrual status: IUD). Contraception: IUD Last Pap: 2022. Results were: normal Last mammogram: n/a. Results were: n/a  Obstetric History OB History  Gravida Para Term Preterm AB Living  1 1 1  0 0 1  SAB IAB Ectopic Multiple Live Births  0 0 0 0 1    # Outcome Date GA Lbr Len/2nd Weight Sex Delivery Anes PTL Lv  1 Term 08/02/19 [redacted]w[redacted]d 01:38 / 00:53 7 lb 7.4 oz (3.385 kg) M Vag-Spont EPI  LIV    Past Medical History:  Diagnosis Date   Medical history non-contributory     Past Surgical History:  Procedure Laterality Date   NO PAST SURGERIES     WISDOM TOOTH EXTRACTION       Current Outpatient Medications:    levonorgestrel (MIRENA) 20 MCG/24HR IUD, 1 each by Intrauterine route once., Disp: , Rfl:    metroNIDAZOLE (FLAGYL) 500 MG tablet, Take 1 tablet (500 mg total) by mouth 2 (two) times daily., Disp: 14 tablet, Rfl: 2   amoxicillin (AMOXIL) 500 MG capsule, Take 1 capsule (500 mg total) by mouth 2 (two) times  daily. (Patient not taking: Reported on 02/01/2023), Disp: 20 capsule, Rfl: 0 No Known Allergies  Social History   Tobacco Use   Smoking status: Never   Smokeless tobacco: Never  Substance Use Topics   Alcohol use: Not Currently    Comment: social     Family History  Problem Relation Age of Onset   Hyperlipidemia Mother    Heart disease Maternal Grandmother    Kidney disease Maternal Grandmother    Diabetes Maternal Grandmother    Hypertension Maternal Grandmother    Asthma Maternal Grandmother    Thyroid disease Maternal Grandmother    Heart disease Paternal Grandmother    Kidney disease Paternal Grandmother    Diabetes Paternal Grandmother    Hypertension Paternal Grandmother    Asthma Paternal Grandmother       Review of Systems  Constitutional: negative for fatigue and weight loss Respiratory: negative for cough and wheezing Cardiovascular: negative for chest pain, fatigue and palpitations Gastrointestinal: negative for abdominal pain and change in bowel habits Musculoskeletal:negative for myalgias Neurological: negative for gait problems and tremors Behavioral/Psych: negative for abusive relationship, depression Endocrine: negative for temperature intolerance    Genitourinary:  positive for vaginal discharge.   negative for abnormal menstrual periods, genital lesions, hot flashes, sexual problems  Integument/breast: negative for breast lump, breast tenderness, nipple discharge and skin lesion(s)    Objective:  BP 108/72   Pulse 70   Ht 5\' 3"  (1.6 m)   Wt 174 lb (78.9 kg)   BMI 30.82 kg/m  General:   Alert and no distress  Skin:   no rash or abnormalities  Lungs:   clear to auscultation bilaterally  Heart:   regular rate and rhythm, S1, S2 normal, no murmur, click, rub or gallop  Breasts:   normal without suspicious masses, skin or nipple changes or axillary nodes  Abdomen:  normal findings: no organomegaly, soft, non-tender and no hernia  Pelvis:   External genitalia: normal general appearance Urinary system: urethral meatus normal and bladder without fullness, nontender Vaginal: normal without tenderness, induration or masses Cervix: normal appearance Adnexa: normal bimanual exam Uterus: anteverted and non-tender, normal size   Lab Review Urine pregnancy test Labs reviewed yes Radiologic studies reviewed no  I have spent a total of 20 minutes of face-to-face time, excluding clinical staff time, reviewing notes and preparing to see patient, ordering tests and/or medications, and counseling the patient.   Assessment:    1. Encounter for routine gynecological examination with Papanicolaou smear of cervix Rx: - Cytology - PAP( [place])  2. Vaginal discharge Rx: - Cervicovaginal ancillary only( [facility]) - metroNIDAZOLE (FLAGYL) 500 MG tablet; Take 1 tablet (500 mg total) by mouth 2 (two) times daily.  Dispense: 14 tablet; Refill: 2  3. Screening for STD (sexually transmitted disease) Rx: - HIV antibody (with reflex) - RPR - Hepatitis B Surface AntiGEN - Hepatitis C Antibody  4. Obesity (BMI 30.0-34.9) - weight reduction recommended  5. IUD Surveillance - doing well    Plan:    Education reviewed: calcium supplements, depression evaluation, low fat, low cholesterol diet, safe sex/STD prevention, self breast exams, and weight bearing exercise. Contraception: IUD. Follow up in: 1 year.   Meds ordered this encounter  Medications   metroNIDAZOLE (FLAGYL) 500 MG tablet    Sig: Take 1 tablet (500 mg total) by mouth 2 (two) times daily.    Dispense:  14 tablet    Refill:  2   Orders Placed This Encounter  Procedures   HIV antibody (with reflex)   RPR   Hepatitis B Surface AntiGEN   Hepatitis C Antibody     Shelly Bombard, MD 02/01/2023 10:34 AM

## 2023-02-01 NOTE — Progress Notes (Signed)
Pt is in the office for annual Last pap 05/10/21 Currently has IUD , placed in 2020 Pt desires std testing.

## 2023-02-02 LAB — RPR: RPR Ser Ql: NONREACTIVE

## 2023-02-02 LAB — HEPATITIS B SURFACE ANTIGEN: Hepatitis B Surface Ag: NEGATIVE

## 2023-02-02 LAB — HEPATITIS C ANTIBODY: Hep C Virus Ab: NONREACTIVE

## 2023-02-02 LAB — HIV ANTIBODY (ROUTINE TESTING W REFLEX): HIV Screen 4th Generation wRfx: NONREACTIVE

## 2023-02-04 LAB — CERVICOVAGINAL ANCILLARY ONLY
Bacterial Vaginitis (gardnerella): POSITIVE — AB
Candida Glabrata: NEGATIVE
Candida Vaginitis: NEGATIVE
Chlamydia: NEGATIVE
Comment: NEGATIVE
Comment: NEGATIVE
Comment: NEGATIVE
Comment: NEGATIVE
Comment: NEGATIVE
Comment: NORMAL
Neisseria Gonorrhea: NEGATIVE
Trichomonas: NEGATIVE

## 2023-02-08 ENCOUNTER — Other Ambulatory Visit: Payer: Self-pay | Admitting: Obstetrics

## 2023-02-11 LAB — CYTOLOGY - PAP: Diagnosis: NEGATIVE

## 2023-09-21 ENCOUNTER — Encounter (HOSPITAL_COMMUNITY): Payer: Self-pay

## 2023-09-21 ENCOUNTER — Ambulatory Visit (HOSPITAL_COMMUNITY)
Admission: EM | Admit: 2023-09-21 | Discharge: 2023-09-21 | Disposition: A | Discharge status: Home / Self Care | Payer: 59 | Attending: Emergency Medicine | Admitting: Emergency Medicine

## 2023-09-21 DIAGNOSIS — J069 Acute upper respiratory infection, unspecified: Secondary | ICD-10-CM | POA: Diagnosis not present

## 2023-09-21 LAB — POC COVID19/FLU A&B COMBO
Covid Antigen, POC: NEGATIVE
Influenza A Antigen, POC: NEGATIVE
Influenza B Antigen, POC: NEGATIVE

## 2023-09-21 MED ORDER — FLUTICASONE PROPIONATE 50 MCG/ACT NA SUSP: 2.0000 | Freq: Every day | NASAL | 2 refills | Status: AC

## 2023-09-21 MED ORDER — GUAIFENESIN 100 MG/5ML PO LIQD: 10.0000 mL | ORAL | 0 refills | Status: AC | PRN

## 2023-09-21 NOTE — ED Provider Notes (Signed)
[facility]    CSN: [id number] Arrival date & time: 09/21/23  1124     History   Chief Complaint Chief Complaint  Patient presents with   Cough   Nasal Congestion   Fever    HPI Alexandra Blackburn is a 27 y.o. female.  3 day history of congestion, cough, sore throat, body aches Cough started dry but now a little production  Not having fever Taking sudafed without much relief Son was sick with similar. Possible sick contacts at work  Past Medical History:  Diagnosis Date   Medical history non-contributory     Patient Active Problem List   Diagnosis Date Noted   IUD (intrauterine device) in place 09/01/2019   Dysmenorrhea 10/23/2013    Past Surgical History:  Procedure Laterality Date   NO PAST SURGERIES     WISDOM TOOTH EXTRACTION      OB History     Gravida  1   Para  1   Term  1   Preterm  0   AB  0   Living  1      SAB  0   IAB  0   Ectopic  0   Multiple  0   Live Births  1            Home Medications    Prior to Admission medications   Medication Sig Start Date End Date Taking? Authorizing Provider  fluticasone (FLONASE) 50 MCG/ACT nasal spray Place 2 sprays into both nostrils daily. 09/21/23  Yes Galadriel Shroff, Lurena Joiner, PA-C  guaiFENesin (ROBITUSSIN) 100 MG/5ML liquid Take 10 mLs by mouth every 4 (four) hours as needed for cough or to loosen phlegm. 09/21/23  Yes Doug Bucklin, Lurena Joiner, PA-C  levonorgestrel (MIRENA) 20 MCG/24HR IUD 1 each by Intrauterine route once.   Yes [provider]    Family History Family History  Problem Relation Age of Onset   Hyperlipidemia Mother    Heart disease Maternal Grandmother    Kidney disease Maternal Grandmother    Diabetes Maternal Grandmother    Hypertension Maternal Grandmother    Asthma Maternal Grandmother    Thyroid disease Maternal Grandmother    Heart disease Paternal Grandmother    Kidney disease Paternal Grandmother    Diabetes Paternal Grandmother    Hypertension  Paternal Grandmother    Asthma Paternal Grandmother     Social History Social History   Tobacco Use   Smoking status: Never   Smokeless tobacco: Never  Vaping Use   Vaping status: Never Used  Substance Use Topics   Alcohol use: Not Currently    Comment: social    Drug use: No     Allergies   Patient has no known allergies.   Review of Systems Review of Systems Per hPI  Physical Exam Triage Vital Signs ED Triage Vitals  Encounter Vitals Group     BP 09/21/23 1235 113/82     Systolic BP Percentile --      Diastolic BP Percentile --      Pulse Rate 09/21/23 1235 89     Resp 09/21/23 1235 16     Temp 09/21/23 1235 98.6 F (37 C)     Temp Source 09/21/23 1235 Oral     SpO2 09/21/23 1235 98 %     Weight --      Height --      Head Circumference --      Peak Flow --      Pain Score 09/21/23  1239 3     Pain Loc --      Pain Education --      Exclude from Growth Chart --    No data found.  Updated Vital Signs BP 113/82 (BP Location: Left Arm)   Pulse 89   Temp 98.6 F (37 C) (Oral)   Resp 16   SpO2 98%   Physical Exam Vitals and nursing note reviewed.  Constitutional:      Appearance: She is not ill-appearing.  HENT:     Right Ear: Tympanic membrane and ear canal normal.     Left Ear: Tympanic membrane and ear canal normal.     Nose: Congestion present. No rhinorrhea.     Mouth/Throat:     Mouth: Mucous membranes are moist.     Pharynx: Oropharynx is clear. No posterior oropharyngeal erythema.  Eyes:     Conjunctiva/sclera: Conjunctivae normal.  Cardiovascular:     Rate and Rhythm: Normal rate and regular rhythm.     Pulses: Normal pulses.     Heart sounds: Normal heart sounds.  Pulmonary:     Effort: Pulmonary effort is normal.     Breath sounds: Normal breath sounds.  Musculoskeletal:     Cervical back: Normal range of motion.  Lymphadenopathy:     Cervical: No cervical adenopathy.  Skin:    General: Skin is warm and dry.  Neurological:      Mental Status: She is alert and oriented to person, place, and time.    UC Treatments / Results  Labs (all labs ordered are listed, but only abnormal results are displayed) Labs Reviewed  POC COVID19/FLU A&B COMBO    EKG  Radiology No results found.  Procedures Procedures   Medications Ordered in UC Medications - No data to display  Initial Impression / Assessment and Plan / UC Course  I have reviewed the triage vital signs and the nursing notes.  Pertinent labs & imaging results that were available during my care of the patient were reviewed by me and considered in my medical decision making (see chart for details).  Afebrile in clinic POC covid/flu negative  Advised continuing symptomatic care, other OTC options. Can return if needed. Work note provided. Patient agreeable to plan, no questions at this time   Final Clinical Impressions(s) / UC Diagnoses   Final diagnoses:  Viral URI with cough     Discharge Instructions      I will call you if the covid or flu test is positive Continue symptomatic care The robitussin cough syrup can be used every 4 hours Drink lots of fluids!     ED Prescriptions     Medication Sig Dispense Auth. Provider   guaiFENesin (ROBITUSSIN) 100 MG/5ML liquid Take 10 mLs by mouth every 4 (four) hours as needed for cough or to loosen phlegm. 180 mL Jonica Bickhart, PA-C   fluticasone (FLONASE) 50 MCG/ACT nasal spray Place 2 sprays into both nostrils daily. 9.9 mL Cruze Zingaro, Lurena Joiner, PA-C      PDMP not reviewed this encounter.   Kathrine Haddock 09/21/23 1341

## 2023-09-21 NOTE — ED Triage Notes (Signed)
Here for sore throat, fatigue and body aches x 3 days. Pt has not taken anything to help with symptoms.

## 2023-09-21 NOTE — Discharge Instructions (Signed)
I will call you if the covid or flu test is positive Continue symptomatic care The robitussin cough syrup can be used every 4 hours Drink lots of fluids!

## 2024-06-08 ENCOUNTER — Ambulatory Visit: Admitting: Advanced Practice Midwife

## 2024-06-08 ENCOUNTER — Encounter: Payer: Self-pay | Admitting: Advanced Practice Midwife

## 2024-06-08 VITALS — BP 106/66 | HR 80 | Ht 63.0 in | Wt 193.0 lb

## 2024-06-08 DIAGNOSIS — Z975 Presence of (intrauterine) contraceptive device: Secondary | ICD-10-CM

## 2024-06-08 DIAGNOSIS — Z30431 Encounter for routine checking of intrauterine contraceptive device: Secondary | ICD-10-CM

## 2024-06-08 DIAGNOSIS — Z01419 Encounter for gynecological examination (general) (routine) without abnormal findings: Secondary | ICD-10-CM | POA: Diagnosis not present

## 2024-06-08 DIAGNOSIS — N939 Abnormal uterine and vaginal bleeding, unspecified: Secondary | ICD-10-CM | POA: Diagnosis not present

## 2024-06-08 NOTE — Progress Notes (Signed)
   Subjective:     Alexandra Blackburn is a 28 y.o. female here at [facility] for a routine exam.  Current complaints: last month her period was heavy with large clots, which is unusual since she usually has  spotting only with Liletta  IUD.  Personal and family health history reviewed: yes.  Do you have a primary care provider? yes Do you feel safe at home? yes  Flowsheet Row Office Visit from 02/01/2023 in [facility] Total Score 0    Health Maintenance Due  Topic Date Due   Hepatitis B Vaccines (1 of 3 - 19+ 3-dose series) Never done   HPV VACCINES (1 - 3-dose SCDM series) Never done   COVID-19 Vaccine (2 - 2024-25 season) 07/21/2023     Risk factors for chronic health problems: Smoking: Alchohol/how much: Pt BMI: Body mass index is 34.19 kg/m.   Gynecologic History No LMP recorded. (Menstrual status: IUD). Contraception: IUD Last Pap: 02/01/23. Results were: normal Last mammogram: n/a.   Obstetric History OB History  Gravida Para Term Preterm AB Living  1 1 1  0 0 1  SAB IAB Ectopic Multiple Live Births  0 0 0 0 1    # Outcome Date GA Lbr Len/2nd Weight Sex Type Anes PTL Lv  1 Term 08/02/19 [redacted]w[redacted]d 01:38 / 00:53 7 lb 7.4 oz (3.385 kg) M Vag-Spont EPI  LIV     The following portions of the patient's history were reviewed and updated as appropriate: allergies, current medications, past family history, past medical history, past social history, past surgical history, and problem list.  Review of Systems Pertinent items noted in HPI and remainder of comprehensive ROS otherwise negative.    Objective:   Today's Vitals   06/08/24 1538  BP: 106/66  Pulse: 80  Weight: 193 lb (87.5 kg)  Height: 5' 3 (1.6 m)   Body mass index is 34.19 kg/m.  VS reviewed, nursing note reviewed,  Constitutional: well developed, well nourished, no distress HEENT: normocephalic, thyroid without enlargement or mass HEART: RRR, no murmurs  rubs/gallops RESP: clear and equal to auscultation bilaterally in all lobes  Breast Exam:   exam performed: right breast normal without mass, skin or nipple changes or axillary nodes, left breast normal without mass, skin or nipple changes or axillary nodes Abdomen: soft Neuro: alert and oriented x 3 Skin: warm, dry Psych: affect normal Pelvic exam:Performed: Cervix pink, visually closed, without lesion, Liletta  IUD string visible, ~ 3 cm in length from cervical os, scant white creamy discharge, vaginal walls and external genitalia normal Bimanual exam: Cervix 0/long/high, firm, anterior, neg CMT, uterus nontender, nonenlarged, adnexa without tenderness, enlargement, or mass        Assessment/Plan:   1. Encounter for annual routine gynecological examination (Primary)   2. Abnormal uterine bleeding (AUB) --IUD appears to be normal --Single episode of bleeding may be outlier, pt to watch bleeding and follow up if heavy or frequent bleeding persists --Liletta  is 29 years old, discussed how device is approved for 7, and possibly 8 years for contraception but can be replaced anytime bleeding is abnormal.    3. IUD check up ---IUD string wnl  4. IUD (intrauterine device) in place      No follow-ups on file.   Olam Boards, CNM 3:54 PM

## 2024-06-08 NOTE — Progress Notes (Signed)
 Pt presents for AEX Last PAP 02-01-23 Pt c/o pain and blood clots with most recent period. Declines STD testing

## 2024-09-14 ENCOUNTER — Other Ambulatory Visit: Payer: Self-pay | Admitting: *Deleted

## 2024-09-14 ENCOUNTER — Ambulatory Visit: Attending: Physician Assistant

## 2024-09-14 DIAGNOSIS — R002 Palpitations: Secondary | ICD-10-CM

## 2024-09-14 NOTE — Progress Notes (Unsigned)
 Enrolled for [employer] to mail a ZIO XT long term holter monitor to the patients address on file.   EP to read

## 2024-09-28 ENCOUNTER — Telehealth: Payer: Self-pay

## 2024-09-28 NOTE — Telephone Encounter (Signed)
 Pt requesting call back to discuss.

## 2024-09-28 NOTE — Telephone Encounter (Signed)
 Order for home heart monitor appears to have been placed 09/14/24. Patient has not received any further information about whether her insurance has approved. She requests update about when she can expect to receive home heart monitor.

## 2024-09-28 NOTE — Telephone Encounter (Signed)
 Patient calling to follow up on receiving heart monitor. Please advise.

## 2024-09-28 NOTE — Telephone Encounter (Signed)
 According to [employer] tracking Alexandra Blackburn's monitor was delivered on 09/16/24, 11:34 am. I described the type of package it would have come in.  Confirmed mailing address with patient.  She will check her mailbox and packages again.  Alexandra Blackburn was also given [employer] number, [phone], to call if she requires another monitor to be mailed out to her.
